# Patient Record
Sex: Female | Born: 1945
Health system: Southern US, Community
[De-identification: ages and names within clinical notes are randomized; demographics above are authoritative.]

## PROBLEM LIST (undated history)

## (undated) DIAGNOSIS — E079 Disorder of thyroid, unspecified: Secondary | ICD-10-CM

## (undated) DIAGNOSIS — E785 Hyperlipidemia, unspecified: Secondary | ICD-10-CM

## (undated) DIAGNOSIS — M549 Dorsalgia, unspecified: Secondary | ICD-10-CM

## (undated) HISTORY — PX: BACK SURGERY: SHX140

## (undated) HISTORY — DX: Hyperlipidemia, unspecified: E78.5

## (undated) HISTORY — DX: Disorder of thyroid, unspecified: E07.9

## (undated) HISTORY — PX: CATARACT EXTRACTION, BILATERAL: SHX1313

---

## 2000-03-13 ENCOUNTER — Encounter: Payer: Self-pay | Admitting: Family Medicine

## 2000-03-13 ENCOUNTER — Encounter: Admission: RE | Admit: 2000-03-13 | Discharge: 2000-03-13 | Payer: Self-pay | Admitting: Family Medicine

## 2001-09-03 ENCOUNTER — Encounter: Admission: RE | Admit: 2001-09-03 | Discharge: 2001-09-03 | Payer: Self-pay | Admitting: Family Medicine

## 2001-09-03 ENCOUNTER — Encounter: Payer: Self-pay | Admitting: Family Medicine

## 2003-01-01 ENCOUNTER — Encounter: Admission: RE | Admit: 2003-01-01 | Discharge: 2003-01-01 | Payer: Self-pay | Admitting: Family Medicine

## 2003-01-01 ENCOUNTER — Encounter: Payer: Self-pay | Admitting: Family Medicine

## 2004-05-11 ENCOUNTER — Other Ambulatory Visit: Admission: RE | Admit: 2004-05-11 | Discharge: 2004-05-11 | Payer: Self-pay | Admitting: Family Medicine

## 2005-05-24 ENCOUNTER — Other Ambulatory Visit: Admission: RE | Admit: 2005-05-24 | Discharge: 2005-05-24 | Payer: Self-pay | Admitting: Family Medicine

## 2006-02-11 ENCOUNTER — Emergency Department (HOSPITAL_COMMUNITY): Admission: EM | Admit: 2006-02-11 | Discharge: 2006-02-11 | Payer: Self-pay | Admitting: Emergency Medicine

## 2006-07-27 ENCOUNTER — Other Ambulatory Visit: Admission: RE | Admit: 2006-07-27 | Discharge: 2006-07-27 | Payer: Self-pay | Admitting: Family Medicine

## 2014-07-01 ENCOUNTER — Encounter: Payer: Self-pay | Admitting: Family Medicine

## 2014-07-01 ENCOUNTER — Ambulatory Visit (INDEPENDENT_AMBULATORY_CARE_PROVIDER_SITE_OTHER): Payer: Medicare Other | Admitting: Family Medicine

## 2014-07-01 VITALS — BP 114/64 | HR 62 | Temp 98.0°F | Ht 63.0 in | Wt 128.1 lb

## 2014-07-01 DIAGNOSIS — Z23 Encounter for immunization: Secondary | ICD-10-CM

## 2014-07-01 DIAGNOSIS — Z0000 Encounter for general adult medical examination without abnormal findings: Secondary | ICD-10-CM

## 2014-07-01 DIAGNOSIS — E785 Hyperlipidemia, unspecified: Secondary | ICD-10-CM

## 2014-07-01 NOTE — Progress Notes (Signed)
Subjective:    Kelly Wade is a 68 y.o. female who presents for a welcome to Medicare exam.   Cardiac risk factors: advanced age (older than 1 for men, 38 for women), dyslipidemia and sedentary lifestyle.  Activities of Daily Living  In your present state of health, do you have any difficulty performing the following activities?:  Preparing food and eating?: No Bathing yourself: No Getting dressed: No Using the toilet:No Moving around from place to place: No In the past year have you fallen or had a near fall?:No  Current exercise habits: The patient does not participate in regular exercise at present.   Dietary issues discussed: na   Depression Screen (Note: if answer to either of the following is "Yes", then a more complete depression screening is indicated)  Q1: Over the past two weeks, have you felt down, depressed or hopeless?no Q2: Over the past two weeks, have you felt little interest or pleasure in doing things? no   The following portions of the patient's history were reviewed and updated as appropriate: allergies, current medications, past family history, past medical history, past social history, past surgical history and problem list. Review of Systems Pertinent items are noted in HPI.    Objective:     Vision by Snellen chart: opth--in [place] ,Blood pressure 114/64, pulse 62, temperature 98 F (36.7 C), temperature source Oral, height 5\' 3"  (1.6 m), weight 128 lb 1.4 oz (58.1 kg), SpO2 99.00%. Body mass index is 22.7 kg/(m^2). BP 114/64  Pulse 62  Temp(Src) 98 F (36.7 C) (Oral)  Ht 5\' 3"  (1.6 m)  Wt 128 lb 1.4 oz (58.1 kg)  BMI 22.70 kg/m2  SpO2 99% General appearance: alert, cooperative, appears stated age and no distress Head: Normocephalic, without obvious abnormality, atraumatic Eyes: conjunctivae/corneas clear. PERRL, EOM's intact. Fundi benign. Ears: normal TM's and external ear canals both ears Nose: Nares normal. Septum midline. Mucosa normal.  No drainage or sinus tenderness. Throat: lips, mucosa, and tongue normal; teeth and gums normal Neck: no adenopathy, no carotid bruit, no JVD, supple, symmetrical, trachea midline and thyroid not enlarged, symmetric, no tenderness/mass/nodules Back: symmetric, no curvature. ROM normal. No CVA tenderness. Lungs: clear to auscultation bilaterally Breasts: normal appearance, no masses or tenderness Heart: regular rate and rhythm, S1, S2 normal, no murmur, click, rub or gallop Abdomen: soft, non-tender; bowel sounds normal; no masses,  no organomegaly Pelvic: not indicated; post-menopausal, no abnormal Pap smears in past Extremities: extremities normal, atraumatic, no cyanosis or edema Pulses: 2+ and symmetric Skin: Skin color, texture, turgor normal. No rashes or lesions Lymph nodes: Cervical, supraclavicular, and axillary nodes normal. Neurologic: Alert and oriented X 3, normal strength and tone. Normal symmetric reflexes. Normal coordination and gait Psych- no depression, no anxiety      Assessment:     cpe      Plan:     During the course of the visit the patient was educated and counseled about appropriate screening and preventive services including:   Pneumococcal vaccine   Screening electrocardiogram  Screening mammography  Bone densitometry screening  Glaucoma screening  Advanced directives: has NO advanced directive - not interested in additional information  Patient Instructions (the written plan) was given to the patient.   1. Welcome to Medicare preventive visit   - EKG [id number] - Basic metabolic panel - CBC with Differential - Hepatic function panel - Lipid panel - POCT urinalysis dipstick  2. Other and unspecified hyperlipidemia Check labs - Basic metabolic panel - CBC with  Differential - Hepatic function panel - Lipid panel - POCT urinalysis dipstick  3. Need for pneumococcal vaccination   - Pneumococcal polysaccharide vaccine 23-valent greater  than or equal to 2yo subcutaneous/IM

## 2014-07-01 NOTE — Patient Instructions (Signed)
Preventive Care for Adults A healthy lifestyle and preventive care can promote health and wellness. Preventive health guidelines for women include the following key practices.  A routine yearly physical is a good way to check with your health care provider about your health and preventive screening. It is a chance to share any concerns and updates on your health and to receive a thorough exam.  Visit your dentist for a routine exam and preventive care every 6 months. Brush your teeth twice a day and floss once a day. Good oral hygiene prevents tooth decay and gum disease.  The frequency of eye exams is based on your age, health, family medical history, use of contact lenses, and other factors. Follow your health care provider's recommendations for frequency of eye exams.  Eat a healthy diet. Foods like vegetables, fruits, whole grains, low-fat dairy products, and lean protein foods contain the nutrients you need without too many calories. Decrease your intake of foods high in solid fats, added sugars, and salt. Eat the right amount of calories for you.Get information about a proper diet from your health care provider, if necessary.  Regular physical exercise is one of the most important things you can do for your health. Most adults should get at least 150 minutes of moderate-intensity exercise (any activity that increases your heart rate and causes you to sweat) each week. In addition, most adults need muscle-strengthening exercises on 2 or more days a week.  Maintain a healthy weight. The body mass index (BMI) is a screening tool to identify possible weight problems. It provides an estimate of body fat based on height and weight. Your health care provider can find your BMI and can help you achieve or maintain a healthy weight.For adults 20 years and older:  A BMI below 18.5 is considered underweight.  A BMI of 18.5 to 24.9 is normal.  A BMI of 25 to 29.9 is considered overweight.  A BMI of  30 and above is considered obese.  Maintain normal blood lipids and cholesterol levels by exercising and minimizing your intake of saturated fat. Eat a balanced diet with plenty of fruit and vegetables. Blood tests for lipids and cholesterol should begin at age 65 and be repeated every 5 years. If your lipid or cholesterol levels are high, you are over 50, or you are at high risk for heart disease, you may need your cholesterol levels checked more frequently.Ongoing high lipid and cholesterol levels should be treated with medicines if diet and exercise are not working.  If you smoke, find out from your health care provider how to quit. If you do not use tobacco, do not start.  Lung cancer screening is recommended for adults aged 42-80 years who are at high risk for developing lung cancer because of a history of smoking. A yearly low-dose CT scan of the lungs is recommended for people who have at least a 30-pack-year history of smoking and are a current smoker or have quit within the past 15 years. A pack year of smoking is smoking an average of 1 pack of cigarettes a day for 1 year (for example: 1 pack a day for 30 years or 2 packs a day for 15 years). Yearly screening should continue until the smoker has stopped smoking for at least 15 years. Yearly screening should be stopped for people who develop a health problem that would prevent them from having lung cancer treatment.  If you are pregnant, do not drink alcohol. If you are breastfeeding,  be very cautious about drinking alcohol. If you are not pregnant and choose to drink alcohol, do not have more than 1 drink per day. One drink is considered to be 12 ounces (355 mL) of beer, 5 ounces (148 mL) of wine, or 1.5 ounces (44 mL) of liquor.  Avoid use of street drugs. Do not share needles with anyone. Ask for help if you need support or instructions about stopping the use of drugs.  High blood pressure causes heart disease and increases the risk of  stroke. Your blood pressure should be checked at least every 1 to 2 years. Ongoing high blood pressure should be treated with medicines if weight loss and exercise do not work.  If you are 45-49 years old, ask your health care provider if you should take aspirin to prevent strokes.  Diabetes screening involves taking a blood sample to check your fasting blood sugar level. This should be done once every 3 years, after age 104, if you are within normal weight and without risk factors for diabetes. Testing should be considered at a younger age or be carried out more frequently if you are overweight and have at least 1 risk factor for diabetes.  Breast cancer screening is essential preventive care for women. You should practice "breast self-awareness." This means understanding the normal appearance and feel of your breasts and may include breast self-examination. Any changes detected, no matter how small, should be reported to a health care provider. Women in their 29s and 30s should have a clinical breast exam (CBE) by a health care provider as part of a regular health exam every 1 to 3 years. After age 52, women should have a CBE every year. Starting at age 46, women should consider having a mammogram (breast X-ray test) every year. Women who have a family history of breast cancer should talk to their health care provider about genetic screening. Women at a high risk of breast cancer should talk to their health care providers about having an MRI and a mammogram every year.  Breast cancer gene (BRCA)-related cancer risk assessment is recommended for women who have family members with BRCA-related cancers. BRCA-related cancers include breast, ovarian, tubal, and peritoneal cancers. Having family members with these cancers may be associated with an increased risk for harmful changes (mutations) in the breast cancer genes BRCA1 and BRCA2. Results of the assessment will determine the need for genetic counseling and  BRCA1 and BRCA2 testing.  Routine pelvic exams to screen for cancer are no longer recommended for nonpregnant women who are considered low risk for cancer of the pelvic organs (ovaries, uterus, and vagina) and who do not have symptoms. Ask your health care provider if a screening pelvic exam is right for you.  If you have had past treatment for cervical cancer or a condition that could lead to cancer, you need Pap tests and screening for cancer for at least 20 years after your treatment. If Pap tests have been discontinued, your risk factors (such as having a new sexual partner) need to be reassessed to determine if screening should be resumed. Some women have medical problems that increase the chance of getting cervical cancer. In these cases, your health care provider may recommend more frequent screening and Pap tests.  The HPV test is an additional test that may be used for cervical cancer screening. The HPV test looks for the virus that can cause the cell changes on the cervix. The cells collected during the Pap test can be  tested for HPV. The HPV test could be used to screen women aged 63 years and older, and should be used in women of any age who have unclear Pap test results. After the age of 41, women should have HPV testing at the same frequency as a Pap test.  Colorectal cancer can be detected and often prevented. Most routine colorectal cancer screening begins at the age of 93 years and continues through age 63 years. However, your health care provider may recommend screening at an earlier age if you have risk factors for colon cancer. On a yearly basis, your health care provider may provide home test kits to check for hidden blood in the stool. Use of a small camera at the end of a tube, to directly examine the colon (sigmoidoscopy or colonoscopy), can detect the earliest forms of colorectal cancer. Talk to your health care provider about this at age 35, when routine screening begins. Direct  exam of the colon should be repeated every 5-10 years through age 24 years, unless early forms of pre-cancerous polyps or small growths are found.  People who are at an increased risk for hepatitis B should be screened for this virus. You are considered at high risk for hepatitis B if:  You were born in a country where hepatitis B occurs often. Talk with your health care provider about which countries are considered high risk.  Your parents were born in a high-risk country and you have not received a shot to protect against hepatitis B (hepatitis B vaccine).  You have HIV or AIDS.  You use needles to inject street drugs.  You live with, or have sex with, someone who has hepatitis B.  You get hemodialysis treatment.  You take certain medicines for conditions like cancer, organ transplantation, and autoimmune conditions.  Hepatitis C blood testing is recommended for all people born from [birth year] through 1965 and any individual with known risks for hepatitis C.  Practice safe sex. Use condoms and avoid high-risk sexual practices to reduce the spread of sexually transmitted infections (STIs). STIs include gonorrhea, chlamydia, syphilis, trichomonas, herpes, HPV, and human immunodeficiency virus (HIV). Herpes, HIV, and HPV are viral illnesses that have no cure. They can result in disability, cancer, and death.  You should be screened for sexually transmitted illnesses (STIs) including gonorrhea and chlamydia if:  You are sexually active and are younger than 24 years.  You are older than 24 years and your health care provider tells you that you are at risk for this type of infection.  Your sexual activity has changed since you were last screened and you are at an increased risk for chlamydia or gonorrhea. Ask your health care provider if you are at risk.  If you are at risk of being infected with HIV, it is recommended that you take a prescription medicine daily to prevent HIV infection. This is  called preexposure prophylaxis (PrEP). You are considered at risk if:  You are a heterosexual woman, are sexually active, and are at increased risk for HIV infection.  You take drugs by injection.  You are sexually active with a partner who has HIV.  Talk with your health care provider about whether you are at high risk of being infected with HIV. If you choose to begin PrEP, you should first be tested for HIV. You should then be tested every 3 months for as long as you are taking PrEP.  Osteoporosis is a disease in which the bones lose minerals and strength  with aging. This can result in serious bone fractures or breaks. The risk of osteoporosis can be identified using a bone density scan. Women ages 71 years and over and women at risk for fractures or osteoporosis should discuss screening with their health care providers. Ask your health care provider whether you should take a calcium supplement or vitamin D to reduce the rate of osteoporosis.  Menopause can be associated with physical symptoms and risks. Hormone replacement therapy is available to decrease symptoms and risks. You should talk to your health care provider about whether hormone replacement therapy is right for you.  Use sunscreen. Apply sunscreen liberally and repeatedly throughout the day. You should seek shade when your shadow is shorter than you. Protect yourself by wearing long sleeves, pants, a wide-brimmed hat, and sunglasses year round, whenever you are outdoors.  Once a month, do a whole body skin exam, using a mirror to look at the skin on your back. Tell your health care provider of new moles, moles that have irregular borders, moles that are larger than a pencil eraser, or moles that have changed in shape or color.  Stay current with required vaccines (immunizations).  Influenza vaccine. All adults should be immunized every year.  Tetanus, diphtheria, and acellular pertussis (Td, Tdap) vaccine. Pregnant women should  receive 1 dose of Tdap vaccine during each pregnancy. The dose should be obtained regardless of the length of time since the last dose. Immunization is preferred during the 27th-36th week of gestation. An adult who has not previously received Tdap or who does not know her vaccine status should receive 1 dose of Tdap. This initial dose should be followed by tetanus and diphtheria toxoids (Td) booster doses every 10 years. Adults with an unknown or incomplete history of completing a 3-dose immunization series with Td-containing vaccines should begin or complete a primary immunization series including a Tdap dose. Adults should receive a Td booster every 10 years.  Varicella vaccine. An adult without evidence of immunity to varicella should receive 2 doses or a second dose if she has previously received 1 dose. Pregnant females who do not have evidence of immunity should receive the first dose after pregnancy. This first dose should be obtained before leaving the health care facility. The second dose should be obtained 4-8 weeks after the first dose.  Human papillomavirus (HPV) vaccine. Females aged 13-26 years who have not received the vaccine previously should obtain the 3-dose series. The vaccine is not recommended for use in pregnant females. However, pregnancy testing is not needed before receiving a dose. If a female is found to be pregnant after receiving a dose, no treatment is needed. In that case, the remaining doses should be delayed until after the pregnancy. Immunization is recommended for any person with an immunocompromised condition through the age of 74 years if she did not get any or all doses earlier. During the 3-dose series, the second dose should be obtained 4-8 weeks after the first dose. The third dose should be obtained 24 weeks after the first dose and 16 weeks after the second dose.  Zoster vaccine. One dose is recommended for adults aged 23 years or older unless certain conditions are  present.  Measles, mumps, and rubella (MMR) vaccine. Adults born before [birth year] generally are considered immune to measles and mumps. Adults born in [birth year] or later should have 1 or more doses of MMR vaccine unless there is a contraindication to the vaccine or there is laboratory evidence of immunity to  each of the three diseases. A routine second dose of MMR vaccine should be obtained at least 28 days after the first dose for students attending postsecondary schools, health care workers, or international travelers. People who received inactivated measles vaccine or an unknown type of measles vaccine during 1963-1967 should receive 2 doses of MMR vaccine. People who received inactivated mumps vaccine or an unknown type of mumps vaccine before 1979 and are at high risk for mumps infection should consider immunization with 2 doses of MMR vaccine. For females of childbearing age, rubella immunity should be determined. If there is no evidence of immunity, females who are not pregnant should be vaccinated. If there is no evidence of immunity, females who are pregnant should delay immunization until after pregnancy. Unvaccinated health care workers born before 1957 who lack laboratory evidence of measles, mumps, or rubella immunity or laboratory confirmation of disease should consider measles and mumps immunization with 2 doses of MMR vaccine or rubella immunization with 1 dose of MMR vaccine.  Pneumococcal 13-valent conjugate (PCV13) vaccine. When indicated, a person who is uncertain of her immunization history and has no record of immunization should receive the PCV13 vaccine. An adult aged 19 years or older who has certain medical conditions and has not been previously immunized should receive 1 dose of PCV13 vaccine. This PCV13 should be followed with a dose of pneumococcal polysaccharide (PPSV23) vaccine. The PPSV23 vaccine dose should be obtained at least 8 weeks after the dose of PCV13 vaccine. An adult aged 19  years or older who has certain medical conditions and previously received 1 or more doses of PPSV23 vaccine should receive 1 dose of PCV13. The PCV13 vaccine dose should be obtained 1 or more years after the last PPSV23 vaccine dose.  Pneumococcal polysaccharide (PPSV23) vaccine. When PCV13 is also indicated, PCV13 should be obtained first. All adults aged 65 years and older should be immunized. An adult younger than age 65 years who has certain medical conditions should be immunized. Any person who resides in a nursing home or long-term care facility should be immunized. An adult smoker should be immunized. People with an immunocompromised condition and certain other conditions should receive both PCV13 and PPSV23 vaccines. People with human immunodeficiency virus (HIV) infection should be immunized as soon as possible after diagnosis. Immunization during chemotherapy or radiation therapy should be avoided. Routine use of PPSV23 vaccine is not recommended for American Indians, [place] Natives, or people younger than 65 years unless there are medical conditions that require PPSV23 vaccine. When indicated, people who have unknown immunization and have no record of immunization should receive PPSV23 vaccine. One-time revaccination 5 years after the first dose of PPSV23 is recommended for people aged 19-64 years who have chronic kidney failure, nephrotic syndrome, asplenia, or immunocompromised conditions. People who received 1-2 doses of PPSV23 before age 65 years should receive another dose of PPSV23 vaccine at age 65 years or later if at least 5 years have passed since the previous dose. Doses of PPSV23 are not needed for people immunized with PPSV23 at or after age 65 years.  Meningococcal vaccine. Adults with asplenia or persistent complement component deficiencies should receive 2 doses of quadrivalent meningococcal conjugate (MenACWY-D) vaccine. The doses should be obtained at least 2 months apart.  Microbiologists working with certain meningococcal bacteria, military recruits, people at risk during an outbreak, and people who travel to or live in countries with a high rate of meningitis should be immunized. A first-year college student up through age   21 years who is living in a residence hall should receive a dose if she did not receive a dose on or after her 16th birthday. Adults who have certain high-risk conditions should receive one or more doses of vaccine.  Hepatitis A vaccine. Adults who wish to be protected from this disease, have certain high-risk conditions, work with hepatitis A-infected animals, work in hepatitis A research labs, or travel to or work in countries with a high rate of hepatitis A should be immunized. Adults who were previously unvaccinated and who anticipate close contact with an international adoptee during the first 60 days after arrival in the [place] from a country with a high rate of hepatitis A should be immunized.  Hepatitis B vaccine. Adults who wish to be protected from this disease, have certain high-risk conditions, may be exposed to blood or other infectious body fluids, are household contacts or sex partners of hepatitis B positive people, are clients or workers in certain care facilities, or travel to or work in countries with a high rate of hepatitis B should be immunized.  Haemophilus influenzae type b (Hib) vaccine. A previously unvaccinated person with asplenia or sickle cell disease or having a scheduled splenectomy should receive 1 dose of Hib vaccine. Regardless of previous immunization, a recipient of a hematopoietic stem cell transplant should receive a 3-dose series 6-12 months after her successful transplant. Hib vaccine is not recommended for adults with HIV infection. Preventive Services / Frequency Ages 20 to 81 years  Blood pressure check.** / Every 1 to 2 years.  Lipid and cholesterol check.** / Every 5 years beginning at age  67.  Clinical breast exam.** / Every 3 years for women in their 51s and 47s.  BRCA-related cancer risk assessment.** / For women who have family members with a BRCA-related cancer (breast, ovarian, tubal, or peritoneal cancers).  Pap test.** / Every 2 years from ages 73 through 27. Every 3 years starting at age 6 through age 65 or 31 with a history of 3 consecutive normal Pap tests.  HPV screening.** / Every 3 years from ages 53 through ages 79 to 54 with a history of 3 consecutive normal Pap tests.  Hepatitis C blood test.** / For any individual with known risks for hepatitis C.  Skin self-exam. / Monthly.  Influenza vaccine. / Every year.  Tetanus, diphtheria, and acellular pertussis (Tdap, Td) vaccine.** / Consult your health care provider. Pregnant women should receive 1 dose of Tdap vaccine during each pregnancy. 1 dose of Td every 10 years.  Varicella vaccine.** / Consult your health care provider. Pregnant females who do not have evidence of immunity should receive the first dose after pregnancy.  HPV vaccine. / 3 doses over 6 months, if 7 and younger. The vaccine is not recommended for use in pregnant females. However, pregnancy testing is not needed before receiving a dose.  Measles, mumps, rubella (MMR) vaccine.** / You need at least 1 dose of MMR if you were born in 1957 or later. You may also need a 2nd dose. For females of childbearing age, rubella immunity should be determined. If there is no evidence of immunity, females who are not pregnant should be vaccinated. If there is no evidence of immunity, females who are pregnant should delay immunization until after pregnancy.  Pneumococcal 13-valent conjugate (PCV13) vaccine.** / Consult your health care provider.  Pneumococcal polysaccharide (PPSV23) vaccine.** / 1 to 2 doses if you smoke cigarettes or if you have certain conditions.  Meningococcal vaccine.** /  1 dose if you are age 77 to 69 years and a [place] living in a residence hall, or have one of several medical conditions, you need to get vaccinated against meningococcal disease. You may also need additional booster doses.  Hepatitis A vaccine.** / Consult your health care provider.  Hepatitis B vaccine.** / Consult your health care provider.  Haemophilus influenzae type b (Hib) vaccine.** / Consult your health care provider. Ages 62 to 63 years  Blood pressure check.** / Every 1 to 2 years.  Lipid and cholesterol check.** / Every 5 years beginning at age 70 years.  Lung cancer screening. / Every year if you are aged 74-80 years and have a 30-pack-year history of smoking and currently smoke or have quit within the past 15 years. Yearly screening is stopped once you have quit smoking for at least 15 years or develop a health problem that would prevent you from having lung cancer treatment.  Clinical breast exam.** / Every year after age 5 years.  BRCA-related cancer risk assessment.** / For women who have family members with a BRCA-related cancer (breast, ovarian, tubal, or peritoneal cancers).  Mammogram.** / Every year beginning at age 12 years and continuing for as long as you are in good health. Consult with your health care provider.  Pap test.** / Every 3 years starting at age 41 years through age 61 or 54 years with a history of 3 consecutive normal Pap tests.  HPV screening.** / Every 3 years from ages 43 years through ages 45 to 26 years with a history of 3 consecutive normal Pap tests.  Fecal occult blood test (FOBT) of stool. / Every year beginning at age 94 years and continuing until age 53 years. You may not need to do this test if you get a colonoscopy every 10 years.  Flexible sigmoidoscopy or colonoscopy.** / Every 5 years for a flexible sigmoidoscopy or every 10 years for a colonoscopy beginning at age 51 years and continuing until age 38 years.  Hepatitis C blood test.** / For all people born from [birth year] through  1965 and any individual with known risks for hepatitis C.  Skin self-exam. / Monthly.  Influenza vaccine. / Every year.  Tetanus, diphtheria, and acellular pertussis (Tdap/Td) vaccine.** / Consult your health care provider. Pregnant women should receive 1 dose of Tdap vaccine during each pregnancy. 1 dose of Td every 10 years.  Varicella vaccine.** / Consult your health care provider. Pregnant females who do not have evidence of immunity should receive the first dose after pregnancy.  Zoster vaccine.** / 1 dose for adults aged 41 years or older.  Measles, mumps, rubella (MMR) vaccine.** / You need at least 1 dose of MMR if you were born in 1957 or later. You may also need a 2nd dose. For females of childbearing age, rubella immunity should be determined. If there is no evidence of immunity, females who are not pregnant should be vaccinated. If there is no evidence of immunity, females who are pregnant should delay immunization until after pregnancy.  Pneumococcal 13-valent conjugate (PCV13) vaccine.** / Consult your health care provider.  Pneumococcal polysaccharide (PPSV23) vaccine.** / 1 to 2 doses if you smoke cigarettes or if you have certain conditions.  Meningococcal vaccine.** / Consult your health care provider.  Hepatitis A vaccine.** / Consult your health care provider.  Hepatitis B vaccine.** / Consult your health care provider.  Haemophilus influenzae type b (Hib) vaccine.** / Consult your health care provider. Ages 54  years and over  Blood pressure check.** / Every 1 to 2 years.  Lipid and cholesterol check.** / Every 5 years beginning at age 67 years.  Lung cancer screening. / Every year if you are aged 58-80 years and have a 30-pack-year history of smoking and currently smoke or have quit within the past 15 years. Yearly screening is stopped once you have quit smoking for at least 15 years or develop a health problem that would prevent you from having lung cancer  treatment.  Clinical breast exam.** / Every year after age 19 years.  BRCA-related cancer risk assessment.** / For women who have family members with a BRCA-related cancer (breast, ovarian, tubal, or peritoneal cancers).  Mammogram.** / Every year beginning at age 55 years and continuing for as long as you are in good health. Consult with your health care provider.  Pap test.** / Every 3 years starting at age 62 years through age 70 or 19 years with 3 consecutive normal Pap tests. Testing can be stopped between 65 and 70 years with 3 consecutive normal Pap tests and no abnormal Pap or HPV tests in the past 10 years.  HPV screening.** / Every 3 years from ages 33 years through ages 95 or 4 years with a history of 3 consecutive normal Pap tests. Testing can be stopped between 65 and 70 years with 3 consecutive normal Pap tests and no abnormal Pap or HPV tests in the past 10 years.  Fecal occult blood test (FOBT) of stool. / Every year beginning at age 40 years and continuing until age 93 years. You may not need to do this test if you get a colonoscopy every 10 years.  Flexible sigmoidoscopy or colonoscopy.** / Every 5 years for a flexible sigmoidoscopy or every 10 years for a colonoscopy beginning at age 29 years and continuing until age 44 years.  Hepatitis C blood test.** / For all people born from [birth year] through 1963 and any individual with known risks for hepatitis C.  Osteoporosis screening.** / A one-time screening for women ages 65 years and over and women at risk for fractures or osteoporosis.  Skin self-exam. / Monthly.  Influenza vaccine. / Every year.  Tetanus, diphtheria, and acellular pertussis (Tdap/Td) vaccine.** / 1 dose of Td every 10 years.  Varicella vaccine.** / Consult your health care provider.  Zoster vaccine.** / 1 dose for adults aged 72 years or older.  Pneumococcal 13-valent conjugate (PCV13) vaccine.** / Consult your health care provider.  Pneumococcal  polysaccharide (PPSV23) vaccine.** / 1 dose for all adults aged 93 years and older.  Meningococcal vaccine.** / Consult your health care provider.  Hepatitis A vaccine.** / Consult your health care provider.  Hepatitis B vaccine.** / Consult your health care provider.  Haemophilus influenzae type b (Hib) vaccine.** / Consult your health care provider. ** Family history and personal history of risk and conditions may change your health care provider's recommendations. Document Released: 12/13/2001 Document Revised: 03/03/2014 Document Reviewed: 03/14/2011 [facility] Patient Information 2015 [place], [place]. This information is not intended to replace advice given to you by your health care provider. Make sure you discuss any questions you have with your health care provider.

## 2014-07-01 NOTE — Progress Notes (Signed)
Pre visit review using our clinic review tool, if applicable. No additional management support is needed unless otherwise documented below in the visit note. 

## 2014-07-02 LAB — CBC WITH DIFFERENTIAL/PLATELET
Basophils Absolute: 0 10*3/uL (ref 0.0–0.1)
Basophils Relative: 0.5 % (ref 0.0–3.0)
Eosinophils Absolute: 0.1 10*3/uL (ref 0.0–0.7)
Eosinophils Relative: 0.9 % (ref 0.0–5.0)
HCT: 37.6 % (ref 36.0–46.0)
Hemoglobin: 12.2 g/dL (ref 12.0–15.0)
Lymphocytes Relative: 38.1 % (ref 12.0–46.0)
Lymphs Abs: 2.7 10*3/uL (ref 0.7–4.0)
MCHC: 32.6 g/dL (ref 30.0–36.0)
MCV: 82.6 fl (ref 78.0–100.0)
Monocytes Absolute: 0.2 10*3/uL (ref 0.1–1.0)
Monocytes Relative: 2.5 % — ABNORMAL LOW (ref 3.0–12.0)
Neutro Abs: 4.1 10*3/uL (ref 1.4–7.7)
Neutrophils Relative %: 58 % (ref 43.0–77.0)
Platelets: 261 10*3/uL (ref 150.0–400.0)
RBC: 4.55 Mil/uL (ref 3.87–5.11)
RDW: 15.6 % — ABNORMAL HIGH (ref 11.5–15.5)
WBC: 7.1 10*3/uL (ref 4.0–10.5)

## 2014-07-02 LAB — BASIC METABOLIC PANEL
BUN: 10 mg/dL (ref 6–23)
CO2: 26 mEq/L (ref 19–32)
Calcium: 9.5 mg/dL (ref 8.4–10.5)
Chloride: 106 mEq/L (ref 96–112)
Creatinine, Ser: 0.7 mg/dL (ref 0.4–1.2)
GFR: 83.48 mL/min (ref >60.00)
Glucose, Bld: 79 mg/dL (ref 70–99)
Potassium: 4.7 mEq/L (ref 3.5–5.1)
Sodium: 139 mEq/L (ref 135–145)

## 2014-07-02 LAB — LIPID PANEL
Cholesterol: 147 mg/dL (ref 0–200)
HDL: 59.2 mg/dL (ref >39.00)
LDL Cholesterol: 71 mg/dL (ref 0–99)
NonHDL: 87.8
Total CHOL/HDL Ratio: 2
Triglycerides: 85 mg/dL (ref 0.0–149.0)
VLDL: 17 mg/dL (ref 0.0–40.0)

## 2014-07-02 LAB — HEPATIC FUNCTION PANEL
ALT: 19 U/L (ref 0–35)
AST: 23 U/L (ref 0–37)
Albumin: 3.7 g/dL (ref 3.5–5.2)
Alkaline Phosphatase: 72 U/L (ref 39–117)
Bilirubin, Direct: 0.1 mg/dL (ref 0.0–0.3)
Total Bilirubin: 0.6 mg/dL (ref 0.2–1.2)
Total Protein: 7.6 g/dL (ref 6.0–8.3)

## 2014-07-09 ENCOUNTER — Telehealth: Payer: Self-pay | Admitting: Family Medicine

## 2014-07-09 NOTE — Telephone Encounter (Signed)
Copy mailed.     KP 

## 2014-07-09 NOTE — Telephone Encounter (Signed)
Pt is requesting lab results from 07/01/14, pt would also like a copy sent by mail.

## 2014-07-14 ENCOUNTER — Telehealth: Payer: Self-pay | Admitting: Family Medicine

## 2014-07-14 NOTE — Telephone Encounter (Signed)
Caller name: Nicholl  Relation to pt: self  Call back number:  [phone]   Reason for call: husband would like to discuss wife back pain.

## 2014-07-14 NOTE — Telephone Encounter (Signed)
Pt called.  Call back.  Please call between 8 and 8:30.  [phone]

## 2014-07-14 NOTE — Telephone Encounter (Signed)
MSG left to call the office      KP 

## 2014-07-15 ENCOUNTER — Other Ambulatory Visit: Payer: Self-pay | Admitting: Family Medicine

## 2014-07-15 DIAGNOSIS — M545 Low back pain, unspecified: Secondary | ICD-10-CM

## 2014-07-15 NOTE — Telephone Encounter (Signed)
Yes she will need a referral.     KP

## 2014-07-15 NOTE — Telephone Encounter (Signed)
She can see either one--- does she need referral?

## 2014-07-15 NOTE — Telephone Encounter (Signed)
Spoke with patient and he stated his wife is having LBP, she had surgery in [place] in 2011. He wanted to know what type of doctor she would need to see if it is the Ortho or spine doctor? She has rods, ad screws in her back. He has all of the paperwork from [place] as well as X-rays, Max Cohen [phone] is in the network as well as Richardo Priest [address] [phone], both of these providers are Orthopedics.  Please advise     KP

## 2014-07-18 ENCOUNTER — Emergency Department (HOSPITAL_COMMUNITY)
Admission: EM | Admit: 2014-07-18 | Discharge: 2014-07-18 | Disposition: A | Discharge status: Home / Self Care | Payer: Medicare Other | Attending: Emergency Medicine | Admitting: Emergency Medicine

## 2014-07-18 ENCOUNTER — Emergency Department (HOSPITAL_COMMUNITY): Payer: Medicare Other

## 2014-07-18 ENCOUNTER — Encounter (HOSPITAL_COMMUNITY): Payer: Self-pay | Admitting: Emergency Medicine

## 2014-07-18 DIAGNOSIS — Z79899 Other long term (current) drug therapy: Secondary | ICD-10-CM | POA: Insufficient documentation

## 2014-07-18 DIAGNOSIS — G529 Cranial nerve disorder, unspecified: Secondary | ICD-10-CM | POA: Diagnosis not present

## 2014-07-18 DIAGNOSIS — Z9889 Other specified postprocedural states: Secondary | ICD-10-CM | POA: Diagnosis not present

## 2014-07-18 DIAGNOSIS — Z7982 Long term (current) use of aspirin: Secondary | ICD-10-CM | POA: Diagnosis not present

## 2014-07-18 DIAGNOSIS — M545 Low back pain, unspecified: Secondary | ICD-10-CM | POA: Insufficient documentation

## 2014-07-18 DIAGNOSIS — E785 Hyperlipidemia, unspecified: Secondary | ICD-10-CM | POA: Diagnosis not present

## 2014-07-18 HISTORY — DX: Dorsalgia, unspecified: M54.9

## 2014-07-18 LAB — URINALYSIS, ROUTINE W REFLEX MICROSCOPIC
Bilirubin Urine: NEGATIVE
Glucose, UA: NEGATIVE mg/dL
Hgb urine dipstick: NEGATIVE
Ketones, ur: NEGATIVE mg/dL
Nitrite: NEGATIVE
Protein, ur: NEGATIVE mg/dL
Specific Gravity, Urine: 1.009 (ref 1.005–1.030)
Urobilinogen, UA: 0.2 mg/dL (ref 0.0–1.0)
pH: 5.5 (ref 5.0–8.0)

## 2014-07-18 LAB — URINE MICROSCOPIC-ADD ON

## 2014-07-18 MED ORDER — PREDNISONE 20 MG PO TABS: 20.0000 mg | ORAL_TABLET | Freq: Two times a day (BID) | ORAL | Status: DC

## 2014-07-18 MED ORDER — HYDROCODONE-ACETAMINOPHEN 5-325 MG PO TABS: 1.0000 | ORAL_TABLET | ORAL | Status: DC | PRN

## 2014-07-18 NOTE — ED Notes (Addendum)
Pt presents with NAD- lumbar surgery in [place] 2011. Pt denies injury. Pt c/o of lumbar pain for several days. Pt took advil  400mg  before arrival with several hours relief then pain will start again. Pt denies numbness and tingling. Pt ambulates without problems. Denies N/V/D and fever and urinary problems

## 2014-07-18 NOTE — ED Notes (Signed)
Patient was educated not to drive, operate heavy machinery, or drink alcohol while taking narcotic medication.  

## 2014-07-18 NOTE — ED Notes (Signed)
MD at bedside. 

## 2014-07-18 NOTE — Discharge Instructions (Signed)
Back Pain, Adult °Low back pain is very common. About 1 in 5 people have back pain. The cause of low back pain is rarely dangerous. The pain often gets better over time. About half of people with a sudden onset of back pain feel better in just 2 weeks. About 8 in 10 people feel better by 6 weeks.  °CAUSES °Some common causes of back pain include: °· Strain of the muscles or ligaments supporting the spine. °· Wear and tear (degeneration) of the spinal discs. °· Arthritis. °· Direct injury to the back. °DIAGNOSIS °Most of the time, the direct cause of low back pain is not known. However, back pain can be treated effectively even when the exact cause of the pain is unknown. Answering your caregiver's questions about your overall health and symptoms is one of the most accurate ways to make sure the cause of your pain is not dangerous. If your caregiver needs more information, he or she may order lab work or imaging tests (X-rays or MRIs). However, even if imaging tests show changes in your back, this usually does not require surgery. °HOME CARE INSTRUCTIONS °For many people, back pain returns. Since low back pain is rarely dangerous, it is often a condition that people can learn to manage on their own.  °· Remain active. It is stressful on the back to sit or stand in one place. Do not sit, drive, or stand in one place for more than 30 minutes at a time. Take short walks on level surfaces as soon as pain allows. Try to increase the length of time you walk each day. °· Do not stay in bed. Resting more than 1 or 2 days can delay your recovery. °· Do not avoid exercise or work. Your body is made to move. It is not dangerous to be active, even though your back may hurt. Your back will likely heal faster if you return to being active before your pain is gone. °· Pay attention to your body when you  bend and lift. Many people have less discomfort when lifting if they bend their knees, keep the load close to their bodies, and  avoid twisting. Often, the most comfortable positions are those that put less stress on your recovering back. °· Find a comfortable position to sleep. Use a firm mattress and lie on your side with your knees slightly bent. If you lie on your back, put a pillow under your knees. °· Only take over-the-counter or prescription medicines as directed by your caregiver. Over-the-counter medicines to reduce pain and inflammation are often the most helpful. Your caregiver may prescribe muscle relaxant drugs. These medicines help dull your pain so you can more quickly return to your normal activities and healthy exercise. °· Put ice on the injured area. °¨ Put ice in a plastic bag. °¨ Place a towel between your skin and the bag. °¨ Leave the ice on for 15-20 minutes, 03-04 times a day for the first 2 to 3 days. After that, ice and heat may be alternated to reduce pain and spasms. °· Ask your caregiver about trying back exercises and gentle massage. This may be of some benefit. °· Avoid feeling anxious or stressed. Stress increases muscle tension and can worsen back pain. It is important to recognize when you are anxious or stressed and learn ways to manage it. Exercise is a great option. °SEEK MEDICAL CARE IF: °· You have pain that is not relieved with rest or medicine. °· You have pain that does not improve in 1 week. °· You have new symptoms. °· You are generally not feeling well. °SEEK   IMMEDIATE MEDICAL CARE IF:  °· You have pain that radiates from your back into your legs. °· You develop new bowel or bladder control problems. °· You have unusual weakness or numbness in your arms or legs. °· You develop nausea or vomiting. °· You develop abdominal pain. °· You feel faint. °Document Released: 10/17/2005 Document Revised: 04/17/2012 Document Reviewed: 02/18/2014 °[employer]® Patient Information ©2015 [employer]. This information is not intended to replace advice given to you by your health care provider. Make sure you  discuss any questions you have with your health care provider. ° °Back Exercises °Back exercises help treat and prevent back injuries. The goal of back exercises is to increase the strength of your abdominal and back muscles and the flexibility of your back. These exercises should be started when you no longer have back pain. Back exercises include: °· Pelvic Tilt. Lie on your back with your knees bent. Tilt your pelvis until the lower part of your back is against the floor. Hold this position 5 to 10 sec and repeat 5 to 10 times. °· Knee to Chest. Pull first 1 knee up against your chest and hold for 20 to 30 seconds, repeat this with the other knee, and then both knees. This may be done with the other leg straight or bent, whichever feels better. °· Sit-Ups or Curl-Ups. Bend your knees 90 degrees. Start with tilting your pelvis, and do a partial, slow sit-up, lifting your trunk only 30 to 45 degrees off the floor. Take at least 2 to 3 seconds for each sit-up. Do not do sit-ups with your knees out straight. If partial sit-ups are difficult, simply do the above but with only tightening your abdominal muscles and holding it as directed. °· Hip-Lift. Lie on your back with your knees flexed 90 degrees. Push down with your feet and shoulders as you raise your hips a couple inches off the floor; hold for 10 seconds, repeat 5 to 10 times. °· Back arches. Lie on your stomach, propping yourself up on bent elbows. Slowly press on your hands, causing an arch in your low back. Repeat 3 to 5 times. Any initial stiffness and discomfort should lessen with repetition over time. °· Shoulder-Lifts. Lie face down with arms beside your body. Keep hips and torso pressed to floor as you slowly lift your head and shoulders off the floor. °Do not overdo your exercises, especially in the beginning. Exercises may cause you some mild back discomfort which lasts for a few minutes; however, if the pain is more severe, or lasts for more than 15  minutes, do not continue exercises until you see your caregiver. Improvement with exercise therapy for back problems is slow.  °See your caregivers for assistance with developing a proper back exercise program. °Document Released: 11/24/2004 Document Revised: 01/09/2012 Document Reviewed: 08/18/2011 °ExitCare® Patient Information ©2015 [employer]. This information is not intended to replace advice given to you by your health care provider. Make sure you discuss any questions you have with your health care provider. ° °

## 2014-07-18 NOTE — ED Provider Notes (Signed)
CSN: [id number]     Arrival date & time 07/18/14  1317 History   First MD Initiated Contact with Patient 07/18/14 1456     Chief Complaint  Patient presents with  . Back Pain     (Consider location/radiation/quality/duration/timing/severity/associated sxs/prior Treatment) HPI  Kelly Wade is a 68 y.o. female since her onset of spontaneous back pain. Pain is left lower. There is no radicular component. There are no altered stooling or urine habits. She denies fever, nausea, vomiting, weakness, or dizziness. No change in ability to walk. She had surgery, lumbar fusion, 4 years ago. No real problems, since then. She is only using Advil, with partial relief of the discomfort. There are no known modifying factors.   Past Medical History  Diagnosis Date  . Hyperlipidemia   . Back pain    Past Surgical History  Procedure Laterality Date  . Back surgery      In [place]  . Cataract extraction, bilateral     Family History  Problem Relation Age of Onset  . Heart disease Father   . Heart disease Brother    History  Substance Use Topics  . Smoking status: Never Smoker   . Smokeless tobacco: Never Used  . Alcohol Use: No   OB History   Grav Para Term Preterm Abortions TAB SAB Ect Mult Living                 Review of Systems  All other systems reviewed and are negative.     Allergies  Review of patient's allergies indicates no known allergies.  Home Medications   Prior to Admission medications   Medication Sig Start Date End Date Taking? Authorizing Provider  aspirin 81 MG tablet Take 81 mg by mouth daily.   Yes Historical Provider, MD  atorvastatin (LIPITOR) 10 MG tablet Take 10 mg by mouth daily.   Yes Historical Provider, MD  Calcium Carbonate-Vit D-Min (CALCIUM 1200 PO) Take 2 tablets by mouth daily.   Yes Historical Provider, MD  ibuprofen (ADVIL,MOTRIN) 200 MG tablet Take 400 mg by mouth every 6 (six) hours as needed for mild pain.   Yes Historical Provider, MD   Multiple Vitamins-Minerals (MULTIVITAMIN WITH MINERALS) tablet Take 1 tablet by mouth daily.   Yes Historical Provider, MD  OVER THE COUNTER MEDICATION Take 1 tablet by mouth daily. FLex Seed Oil   Yes Historical Provider, MD   BP 162/79  Pulse 74  Temp(Src) 98.1 F (36.7 C) (Oral)  Resp 16  Ht 5\' 6"  (1.676 m)  Wt 128 lb (58.06 kg)  BMI 20.67 kg/m2  SpO2 100% Physical Exam  Nursing note and vitals reviewed. Constitutional: She is oriented to person, place, and time. She appears well-developed and well-nourished.  HENT:  Head: Normocephalic and atraumatic.  Eyes: Conjunctivae and EOM are normal. Pupils are equal, round, and reactive to light.  Neck: Normal range of motion and phonation normal. Neck supple.  Cardiovascular: Normal rate.   Pulmonary/Chest: Effort normal. She exhibits no tenderness.  Musculoskeletal: Normal range of motion.  Tender left lumbar without deformity.  Neurological: She is alert and oriented to person, place, and time. A cranial nerve deficit is present. She exhibits normal muscle tone. Coordination normal.  Normal strength and sensation, legs, bilaterally.  Skin: Skin is warm and dry.  Psychiatric: She has a normal mood and affect. Her behavior is normal. Judgment and thought content normal.    ED Course  Procedures (including critical care time)  16:20- no further complaints,  findings discussed with patient and husband   Labs Review Labs Reviewed  URINALYSIS, ROUTINE W REFLEX MICROSCOPIC - Abnormal; Notable for the following:    Leukocytes, UA SMALL (*)    All other components within normal limits  URINE MICROSCOPIC-ADD ON    Imaging Review No results found.   EKG Interpretation None      MDM   Final diagnoses:  Bilateral low back pain without sciatica    Nonspecific back pain, prior surgical hardware is intact and there are no evident acute spinal fractures. No clinical evidence for lumbar myelopathy  Nursing Notes Reviewed/ Care  Coordinated Applicable Imaging Reviewed Interpretation of Laboratory Data incorporated into ED treatment  The patient appears reasonably screened and/or stabilized for discharge and I doubt any other medical condition or other [facility] requiring further screening, evaluation, or treatment in the ED at this time prior to discharge.  Plan: Home Medications- norco, prednisone; Home Treatments- rest; return here if the recommended treatment, does not improve the symptoms; Recommended follow up- PCP prn  Richarda Blade, MD 07/18/14 [phone]

## 2014-09-18 ENCOUNTER — Ambulatory Visit (INDEPENDENT_AMBULATORY_CARE_PROVIDER_SITE_OTHER): Payer: Medicare Other

## 2014-09-18 ENCOUNTER — Telehealth: Payer: Self-pay | Admitting: Family Medicine

## 2014-09-18 DIAGNOSIS — Z23 Encounter for immunization: Secondary | ICD-10-CM

## 2014-09-18 MED ORDER — ATORVASTATIN CALCIUM 10 MG PO TABS: 10.0000 mg | ORAL_TABLET | Freq: Every day | ORAL | Status: DC

## 2014-09-18 NOTE — Telephone Encounter (Signed)
Caller name: Maryann Conners Relation to pt: Call back number: [phone] Pharmacy:   Reason for call: Pt requesting for rx atorvastatin (LIPITOR) 10 MG tablet since pt states only has 20 days of med left. Pt will pick up rx. Please advise.

## 2014-09-18 NOTE — Progress Notes (Signed)
Pre visit review using our clinic review tool, if applicable. No additional management support is needed unless otherwise documented below in the visit note. 

## 2014-09-18 NOTE — Telephone Encounter (Signed)
Patient aware Rx ready for pick up.      KP 

## 2014-09-18 NOTE — Progress Notes (Signed)
Pt tolerated injection well.  No signs of reaction upon leaving the clinic.

## 2015-03-17 ENCOUNTER — Telehealth: Payer: Self-pay | Admitting: Family Medicine

## 2015-03-17 DIAGNOSIS — Z1231 Encounter for screening mammogram for malignant neoplasm of breast: Secondary | ICD-10-CM

## 2015-03-17 DIAGNOSIS — E2839 Other primary ovarian failure: Secondary | ICD-10-CM

## 2015-03-17 NOTE — Telephone Encounter (Signed)
Patient's husband requested CPE,BMD and Mammogram for the patient. Gave him number to [facility] and ordered the Imaging. Apt scheduled, he has switched insurance companies from [employer] to [employer] for her as well.     KP

## 2015-03-17 NOTE — Telephone Encounter (Signed)
Caller: Lacye Mccarn, husband Ph#: [phone]  Pt husband requesting phone call about follow up appts needed. He was reading information from her last AVS and had a question.

## 2015-03-19 ENCOUNTER — Ambulatory Visit
Admission: RE | Admit: 2015-03-19 | Discharge: 2015-03-19 | Disposition: A | Discharge status: Home / Self Care | Payer: Commercial Managed Care - HMO | Source: Ambulatory Visit | Attending: Family Medicine | Admitting: Family Medicine

## 2015-03-19 DIAGNOSIS — E2839 Other primary ovarian failure: Secondary | ICD-10-CM

## 2015-03-19 DIAGNOSIS — Z1231 Encounter for screening mammogram for malignant neoplasm of breast: Secondary | ICD-10-CM

## 2015-03-24 ENCOUNTER — Other Ambulatory Visit: Payer: Self-pay | Admitting: Family Medicine

## 2015-03-24 DIAGNOSIS — R928 Other abnormal and inconclusive findings on diagnostic imaging of breast: Secondary | ICD-10-CM

## 2015-03-27 ENCOUNTER — Ambulatory Visit
Admission: RE | Admit: 2015-03-27 | Discharge: 2015-03-27 | Disposition: A | Discharge status: Home / Self Care | Payer: Commercial Managed Care - HMO | Source: Ambulatory Visit | Attending: Family Medicine | Admitting: Family Medicine

## 2015-03-27 DIAGNOSIS — R928 Other abnormal and inconclusive findings on diagnostic imaging of breast: Secondary | ICD-10-CM

## 2015-04-22 ENCOUNTER — Encounter: Payer: Self-pay | Admitting: Family Medicine

## 2015-05-05 ENCOUNTER — Telehealth: Payer: Self-pay | Admitting: Family Medicine

## 2015-05-05 NOTE — Telephone Encounter (Signed)
Pre visit letter mailed 04/30/15

## 2015-05-20 ENCOUNTER — Telehealth: Payer: Self-pay | Admitting: Behavioral Health

## 2015-05-20 NOTE — Telephone Encounter (Signed)
Unable to reach patient at time of Pre-Visit Call.  Left message for patient to return call when available.    

## 2015-05-21 ENCOUNTER — Ambulatory Visit (INDEPENDENT_AMBULATORY_CARE_PROVIDER_SITE_OTHER): Payer: Commercial Managed Care - HMO | Admitting: Family Medicine

## 2015-05-21 ENCOUNTER — Other Ambulatory Visit (HOSPITAL_COMMUNITY)
Admission: RE | Admit: 2015-05-21 | Discharge: 2015-05-21 | Disposition: A | Discharge status: Home / Self Care | Payer: Commercial Managed Care - HMO | Source: Ambulatory Visit | Attending: Family Medicine | Admitting: Family Medicine

## 2015-05-21 ENCOUNTER — Encounter: Payer: Self-pay | Admitting: Family Medicine

## 2015-05-21 VITALS — BP 126/74 | HR 49 | Temp 97.8°F | Ht 63.75 in | Wt 129.2 lb

## 2015-05-21 DIAGNOSIS — Z0000 Encounter for general adult medical examination without abnormal findings: Secondary | ICD-10-CM | POA: Diagnosis not present

## 2015-05-21 DIAGNOSIS — Z124 Encounter for screening for malignant neoplasm of cervix: Secondary | ICD-10-CM | POA: Insufficient documentation

## 2015-05-21 DIAGNOSIS — E785 Hyperlipidemia, unspecified: Secondary | ICD-10-CM

## 2015-05-21 LAB — BASIC METABOLIC PANEL
BUN: 10 mg/dL (ref 6–23)
CO2: 28 mEq/L (ref 19–32)
Calcium: 9.7 mg/dL (ref 8.4–10.5)
Chloride: 105 mEq/L (ref 96–112)
Creatinine, Ser: 0.84 mg/dL (ref 0.40–1.20)
GFR: 71.93 mL/min (ref >60.00)
Glucose, Bld: 100 mg/dL — ABNORMAL HIGH (ref 70–99)
Potassium: 3.8 mEq/L (ref 3.5–5.1)
Sodium: 139 mEq/L (ref 135–145)

## 2015-05-21 LAB — CBC WITH DIFFERENTIAL/PLATELET
Basophils Absolute: 0 10*3/uL (ref 0.0–0.1)
Basophils Relative: 0.5 % (ref 0.0–3.0)
Eosinophils Absolute: 0.1 10*3/uL (ref 0.0–0.7)
Eosinophils Relative: 1.4 % (ref 0.0–5.0)
HCT: 36 % (ref 36.0–46.0)
Hemoglobin: 11.8 g/dL — ABNORMAL LOW (ref 12.0–15.0)
Lymphocytes Relative: 43.6 % (ref 12.0–46.0)
Lymphs Abs: 2.4 10*3/uL (ref 0.7–4.0)
MCHC: 32.8 g/dL (ref 30.0–36.0)
MCV: 81.5 fl (ref 78.0–100.0)
Monocytes Absolute: 0.3 10*3/uL (ref 0.1–1.0)
Monocytes Relative: 5.8 % (ref 3.0–12.0)
Neutro Abs: 2.7 10*3/uL (ref 1.4–7.7)
Neutrophils Relative %: 48.7 % (ref 43.0–77.0)
Platelets: 240 10*3/uL (ref 150.0–400.0)
RBC: 4.42 Mil/uL (ref 3.87–5.11)
RDW: 15.2 % (ref 11.5–15.5)
WBC: 5.6 10*3/uL (ref 4.0–10.5)

## 2015-05-21 LAB — POCT URINALYSIS DIPSTICK
Bilirubin, UA: NEGATIVE
Blood, UA: NEGATIVE
Glucose, UA: NEGATIVE
Ketones, UA: NEGATIVE
Leukocytes, UA: NEGATIVE
Nitrite, UA: NEGATIVE
Protein, UA: NEGATIVE
Spec Grav, UA: 1.02
Urobilinogen, UA: 0.2
pH, UA: 6

## 2015-05-21 LAB — HEPATIC FUNCTION PANEL
ALT: 17 U/L (ref 0–35)
AST: 19 U/L (ref 0–37)
Albumin: 3.8 g/dL (ref 3.5–5.2)
Alkaline Phosphatase: 74 U/L (ref 39–117)
Bilirubin, Direct: 0.1 mg/dL (ref 0.0–0.3)
Total Bilirubin: 0.4 mg/dL (ref 0.2–1.2)
Total Protein: 7 g/dL (ref 6.0–8.3)

## 2015-05-21 LAB — LIPID PANEL
Cholesterol: 144 mg/dL (ref 0–200)
HDL: 55.9 mg/dL (ref >39.00)
LDL Cholesterol: 70 mg/dL (ref 0–99)
NonHDL: 88.1
Total CHOL/HDL Ratio: 3
Triglycerides: 92 mg/dL (ref 0.0–149.0)
VLDL: 18.4 mg/dL (ref 0.0–40.0)

## 2015-05-21 NOTE — Progress Notes (Signed)
Pre visit review using our clinic review tool, if applicable. No additional management support is needed unless otherwise documented below in the visit note. 

## 2015-05-21 NOTE — Patient Instructions (Signed)
Calcium '1200mg'$  daily and vita D3 1000 u daily---these are over the counter      Preventive Care for Adults A healthy lifestyle and preventive care can promote health and wellness. Preventive health guidelines for women include the following key practices.  A routine yearly physical is a good way to check with your health care provider about your health and preventive screening. It is a chance to share any concerns and updates on your health and to receive a thorough exam.  Visit your dentist for a routine exam and preventive care every 6 months. Brush your teeth twice a day and floss once a day. Good oral hygiene prevents tooth decay and gum disease.  The frequency of eye exams is based on your age, health, family medical history, use of contact lenses, and other factors. Follow your health care provider's recommendations for frequency of eye exams.  Eat a healthy diet. Foods like vegetables, fruits, whole grains, low-fat dairy products, and lean protein foods contain the nutrients you need without too many calories. Decrease your intake of foods high in solid fats, added sugars, and salt. Eat the right amount of calories for you.Get information about a proper diet from your health care provider, if necessary.  Regular physical exercise is one of the most important things you can do for your health. Most adults should get at least 150 minutes of moderate-intensity exercise (any activity that increases your heart rate and causes you to sweat) each week. In addition, most adults need muscle-strengthening exercises on 2 or more days a week.  Maintain a healthy weight. The body mass index (BMI) is a screening tool to identify possible weight problems. It provides an estimate of body fat based on height and weight. Your health care provider can find your BMI and can help you achieve or maintain a healthy weight.For adults 20 years and older:  A BMI below 18.5 is considered underweight.  A BMI of  18.5 to 24.9 is normal.  A BMI of 25 to 29.9 is considered overweight.  A BMI of 30 and above is considered obese.  Maintain normal blood lipids and cholesterol levels by exercising and minimizing your intake of saturated fat. Eat a balanced diet with plenty of fruit and vegetables. Blood tests for lipids and cholesterol should begin at age 55 and be repeated every 5 years. If your lipid or cholesterol levels are high, you are over 50, or you are at high risk for heart disease, you may need your cholesterol levels checked more frequently.Ongoing high lipid and cholesterol levels should be treated with medicines if diet and exercise are not working.  If you smoke, find out from your health care provider how to quit. If you do not use tobacco, do not start.  Lung cancer screening is recommended for adults aged 71-80 years who are at high risk for developing lung cancer because of a history of smoking. A yearly low-dose CT scan of the lungs is recommended for people who have at least a 30-pack-year history of smoking and are a current smoker or have quit within the past 15 years. A pack year of smoking is smoking an average of 1 pack of cigarettes a day for 1 year (for example: 1 pack a day for 30 years or 2 packs a day for 15 years). Yearly screening should continue until the smoker has stopped smoking for at least 15 years. Yearly screening should be stopped for people who develop a health problem that would prevent them  from having lung cancer treatment.  If you are pregnant, do not drink alcohol. If you are breastfeeding, be very cautious about drinking alcohol. If you are not pregnant and choose to drink alcohol, do not have more than 1 drink per day. One drink is considered to be 12 ounces (355 mL) of beer, 5 ounces (148 mL) of wine, or 1.5 ounces (44 mL) of liquor.  Avoid use of street drugs. Do not share needles with anyone. Ask for help if you need support or instructions about stopping the use  of drugs.  High blood pressure causes heart disease and increases the risk of stroke. Your blood pressure should be checked at least every 1 to 2 years. Ongoing high blood pressure should be treated with medicines if weight loss and exercise do not work.  If you are 66-27 years old, ask your health care provider if you should take aspirin to prevent strokes.  Diabetes screening involves taking a blood sample to check your fasting blood sugar level. This should be done once every 3 years, after age 38, if you are within normal weight and without risk factors for diabetes. Testing should be considered at a younger age or be carried out more frequently if you are overweight and have at least 1 risk factor for diabetes.  Breast cancer screening is essential preventive care for women. You should practice "breast self-awareness." This means understanding the normal appearance and feel of your breasts and may include breast self-examination. Any changes detected, no matter how small, should be reported to a health care provider. Women in their 22s and 30s should have a clinical breast exam (CBE) by a health care provider as part of a regular health exam every 1 to 3 years. After age 69, women should have a CBE every year. Starting at age 29, women should consider having a mammogram (breast X-ray test) every year. Women who have a family history of breast cancer should talk to their health care provider about genetic screening. Women at a high risk of breast cancer should talk to their health care providers about having an MRI and a mammogram every year.  Breast cancer gene (BRCA)-related cancer risk assessment is recommended for women who have family members with BRCA-related cancers. BRCA-related cancers include breast, ovarian, tubal, and peritoneal cancers. Having family members with these cancers may be associated with an increased risk for harmful changes (mutations) in the breast cancer genes BRCA1 and  BRCA2. Results of the assessment will determine the need for genetic counseling and BRCA1 and BRCA2 testing.  Routine pelvic exams to screen for cancer are no longer recommended for nonpregnant women who are considered low risk for cancer of the pelvic organs (ovaries, uterus, and vagina) and who do not have symptoms. Ask your health care provider if a screening pelvic exam is right for you.  If you have had past treatment for cervical cancer or a condition that could lead to cancer, you need Pap tests and screening for cancer for at least 20 years after your treatment. If Pap tests have been discontinued, your risk factors (such as having a new sexual partner) need to be reassessed to determine if screening should be resumed. Some women have medical problems that increase the chance of getting cervical cancer. In these cases, your health care provider may recommend more frequent screening and Pap tests.  The HPV test is an additional test that may be used for cervical cancer screening. The HPV test looks for the virus  that can cause the cell changes on the cervix. The cells collected during the Pap test can be tested for HPV. The HPV test could be used to screen women aged 97 years and older, and should be used in women of any age who have unclear Pap test results. After the age of 6, women should have HPV testing at the same frequency as a Pap test.  Colorectal cancer can be detected and often prevented. Most routine colorectal cancer screening begins at the age of 84 years and continues through age 32 years. However, your health care provider may recommend screening at an earlier age if you have risk factors for colon cancer. On a yearly basis, your health care provider may provide home test kits to check for hidden blood in the stool. Use of a small camera at the end of a tube, to directly examine the colon (sigmoidoscopy or colonoscopy), can detect the earliest forms of colorectal cancer. Talk to your  health care provider about this at age 46, when routine screening begins. Direct exam of the colon should be repeated every 5-10 years through age 66 years, unless early forms of pre-cancerous polyps or small growths are found.  People who are at an increased risk for hepatitis B should be screened for this virus. You are considered at high risk for hepatitis B if:  You were born in a country where hepatitis B occurs often. Talk with your health care provider about which countries are considered high risk.  Your parents were born in a high-risk country and you have not received a shot to protect against hepatitis B (hepatitis B vaccine).  You have HIV or AIDS.  You use needles to inject street drugs.  You live with, or have sex with, someone who has hepatitis B.  You get hemodialysis treatment.  You take certain medicines for conditions like cancer, organ transplantation, and autoimmune conditions.  Hepatitis C blood testing is recommended for all people born from [birth year] through 1965 and any individual with known risks for hepatitis C.  Practice safe sex. Use condoms and avoid high-risk sexual practices to reduce the spread of sexually transmitted infections (STIs). STIs include gonorrhea, chlamydia, syphilis, trichomonas, herpes, HPV, and human immunodeficiency virus (HIV). Herpes, HIV, and HPV are viral illnesses that have no cure. They can result in disability, cancer, and death.  You should be screened for sexually transmitted illnesses (STIs) including gonorrhea and chlamydia if:  You are sexually active and are younger than 24 years.  You are older than 24 years and your health care provider tells you that you are at risk for this type of infection.  Your sexual activity has changed since you were last screened and you are at an increased risk for chlamydia or gonorrhea. Ask your health care provider if you are at risk.  If you are at risk of being infected with HIV, it is  recommended that you take a prescription medicine daily to prevent HIV infection. This is called preexposure prophylaxis (PrEP). You are considered at risk if:  You are a heterosexual woman, are sexually active, and are at increased risk for HIV infection.  You take drugs by injection.  You are sexually active with a partner who has HIV.  Talk with your health care provider about whether you are at high risk of being infected with HIV. If you choose to begin PrEP, you should first be tested for HIV. You should then be tested every 3 months for as long  as you are taking PrEP.  Osteoporosis is a disease in which the bones lose minerals and strength with aging. This can result in serious bone fractures or breaks. The risk of osteoporosis can be identified using a bone density scan. Women ages 34 years and over and women at risk for fractures or osteoporosis should discuss screening with their health care providers. Ask your health care provider whether you should take a calcium supplement or vitamin D to reduce the rate of osteoporosis.  Menopause can be associated with physical symptoms and risks. Hormone replacement therapy is available to decrease symptoms and risks. You should talk to your health care provider about whether hormone replacement therapy is right for you.  Use sunscreen. Apply sunscreen liberally and repeatedly throughout the day. You should seek shade when your shadow is shorter than you. Protect yourself by wearing long sleeves, pants, a wide-brimmed hat, and sunglasses year round, whenever you are outdoors.  Once a month, do a whole body skin exam, using a mirror to look at the skin on your back. Tell your health care provider of new moles, moles that have irregular borders, moles that are larger than a pencil eraser, or moles that have changed in shape or color.  Stay current with required vaccines (immunizations).  Influenza vaccine. All adults should be immunized every  year.  Tetanus, diphtheria, and acellular pertussis (Td, Tdap) vaccine. Pregnant women should receive 1 dose of Tdap vaccine during each pregnancy. The dose should be obtained regardless of the length of time since the last dose. Immunization is preferred during the 27th-36th week of gestation. An adult who has not previously received Tdap or who does not know her vaccine status should receive 1 dose of Tdap. This initial dose should be followed by tetanus and diphtheria toxoids (Td) booster doses every 10 years. Adults with an unknown or incomplete history of completing a 3-dose immunization series with Td-containing vaccines should begin or complete a primary immunization series including a Tdap dose. Adults should receive a Td booster every 10 years.  Varicella vaccine. An adult without evidence of immunity to varicella should receive 2 doses or a second dose if she has previously received 1 dose. Pregnant females who do not have evidence of immunity should receive the first dose after pregnancy. This first dose should be obtained before leaving the health care facility. The second dose should be obtained 4-8 weeks after the first dose.  Human papillomavirus (HPV) vaccine. Females aged 13-26 years who have not received the vaccine previously should obtain the 3-dose series. The vaccine is not recommended for use in pregnant females. However, pregnancy testing is not needed before receiving a dose. If a female is found to be pregnant after receiving a dose, no treatment is needed. In that case, the remaining doses should be delayed until after the pregnancy. Immunization is recommended for any person with an immunocompromised condition through the age of 68 years if she did not get any or all doses earlier. During the 3-dose series, the second dose should be obtained 4-8 weeks after the first dose. The third dose should be obtained 24 weeks after the first dose and 16 weeks after the second dose.  Zoster  vaccine. One dose is recommended for adults aged 66 years or older unless certain conditions are present.  Measles, mumps, and rubella (MMR) vaccine. Adults born before [birth year] generally are considered immune to measles and mumps. Adults born in [birth year] or later should have 1 or more doses of  MMR vaccine unless there is a contraindication to the vaccine or there is laboratory evidence of immunity to each of the three diseases. A routine second dose of MMR vaccine should be obtained at least 28 days after the first dose for students attending postsecondary schools, health care workers, or international travelers. People who received inactivated measles vaccine or an unknown type of measles vaccine during 1963-1967 should receive 2 doses of MMR vaccine. People who received inactivated mumps vaccine or an unknown type of mumps vaccine before 1979 and are at high risk for mumps infection should consider immunization with 2 doses of MMR vaccine. For females of childbearing age, rubella immunity should be determined. If there is no evidence of immunity, females who are not pregnant should be vaccinated. If there is no evidence of immunity, females who are pregnant should delay immunization until after pregnancy. Unvaccinated health care workers born before [birth year] who lack laboratory evidence of measles, mumps, or rubella immunity or laboratory confirmation of disease should consider measles and mumps immunization with 2 doses of MMR vaccine or rubella immunization with 1 dose of MMR vaccine.  Pneumococcal 13-valent conjugate (PCV13) vaccine. When indicated, a person who is uncertain of her immunization history and has no record of immunization should receive the PCV13 vaccine. An adult aged 27 years or older who has certain medical conditions and has not been previously immunized should receive 1 dose of PCV13 vaccine. This PCV13 should be followed with a dose of pneumococcal polysaccharide (PPSV23) vaccine. The PPSV23  vaccine dose should be obtained at least 8 weeks after the dose of PCV13 vaccine. An adult aged 74 years or older who has certain medical conditions and previously received 1 or more doses of PPSV23 vaccine should receive 1 dose of PCV13. The PCV13 vaccine dose should be obtained 1 or more years after the last PPSV23 vaccine dose.  Pneumococcal polysaccharide (PPSV23) vaccine. When PCV13 is also indicated, PCV13 should be obtained first. All adults aged 64 years and older should be immunized. An adult younger than age 59 years who has certain medical conditions should be immunized. Any person who resides in a nursing home or long-term care facility should be immunized. An adult smoker should be immunized. People with an immunocompromised condition and certain other conditions should receive both PCV13 and PPSV23 vaccines. People with human immunodeficiency virus (HIV) infection should be immunized as soon as possible after diagnosis. Immunization during chemotherapy or radiation therapy should be avoided. Routine use of PPSV23 vaccine is not recommended for American Indians, [place] Natives, or people younger than 65 years unless there are medical conditions that require PPSV23 vaccine. When indicated, people who have unknown immunization and have no record of immunization should receive PPSV23 vaccine. One-time revaccination 5 years after the first dose of PPSV23 is recommended for people aged 19-64 years who have chronic kidney failure, nephrotic syndrome, asplenia, or immunocompromised conditions. People who received 1-2 doses of PPSV23 before age 8 years should receive another dose of PPSV23 vaccine at age 42 years or later if at least 5 years have passed since the previous dose. Doses of PPSV23 are not needed for people immunized with PPSV23 at or after age 75 years.  Meningococcal vaccine. Adults with asplenia or persistent complement component deficiencies should receive 2 doses of quadrivalent  meningococcal conjugate (MenACWY-D) vaccine. The doses should be obtained at least 2 months apart. Microbiologists working with certain meningococcal bacteria, [place] recruits, people at risk during an outbreak, and people who travel to or live  in countries with a high rate of meningitis should be immunized. A first-year college student up through age 71 years who is living in a residence hall should receive a dose if she did not receive a dose on or after her 16th birthday. Adults who have certain high-risk conditions should receive one or more doses of vaccine.  Hepatitis A vaccine. Adults who wish to be protected from this disease, have certain high-risk conditions, work with hepatitis A-infected animals, work in hepatitis A research labs, or travel to or work in countries with a high rate of hepatitis A should be immunized. Adults who were previously unvaccinated and who anticipate close contact with an international adoptee during the first 60 days after arrival in the [place] from a country with a high rate of hepatitis A should be immunized.  Hepatitis B vaccine. Adults who wish to be protected from this disease, have certain high-risk conditions, may be exposed to blood or other infectious body fluids, are household contacts or sex partners of hepatitis B positive people, are clients or workers in certain care facilities, or travel to or work in countries with a high rate of hepatitis B should be immunized.  Haemophilus influenzae type b (Hib) vaccine. A previously unvaccinated person with asplenia or sickle cell disease or having a scheduled splenectomy should receive 1 dose of Hib vaccine. Regardless of previous immunization, a recipient of a hematopoietic stem cell transplant should receive a 3-dose series 6-12 months after her successful transplant. Hib vaccine is not recommended for adults with HIV infection. Preventive Services / Frequency Ages 33 to 49 years  Blood pressure check.**  / Every 1 to 2 years.  Lipid and cholesterol check.** / Every 5 years beginning at age 26.  Clinical breast exam.** / Every 3 years for women in their 82s and 20s.  BRCA-related cancer risk assessment.** / For women who have family members with a BRCA-related cancer (breast, ovarian, tubal, or peritoneal cancers).  Pap test.** / Every 2 years from ages 3 through 81. Every 3 years starting at age 19 through age 35 or 75 with a history of 3 consecutive normal Pap tests.  HPV screening.** / Every 3 years from ages 62 through ages 51 to 30 with a history of 3 consecutive normal Pap tests.  Hepatitis C blood test.** / For any individual with known risks for hepatitis C.  Skin self-exam. / Monthly.  Influenza vaccine. / Every year.  Tetanus, diphtheria, and acellular pertussis (Tdap, Td) vaccine.** / Consult your health care provider. Pregnant women should receive 1 dose of Tdap vaccine during each pregnancy. 1 dose of Td every 10 years.  Varicella vaccine.** / Consult your health care provider. Pregnant females who do not have evidence of immunity should receive the first dose after pregnancy.  HPV vaccine. / 3 doses over 6 months, if 56 and younger. The vaccine is not recommended for use in pregnant females. However, pregnancy testing is not needed before receiving a dose.  Measles, mumps, rubella (MMR) vaccine.** / You need at least 1 dose of MMR if you were born in 1957 or later. You may also need a 2nd dose. For females of childbearing age, rubella immunity should be determined. If there is no evidence of immunity, females who are not pregnant should be vaccinated. If there is no evidence of immunity, females who are pregnant should delay immunization until after pregnancy.  Pneumococcal 13-valent conjugate (PCV13) vaccine.** / Consult your health care provider.  Pneumococcal polysaccharide (PPSV23) vaccine.** /  1 to 2 doses if you smoke cigarettes or if you have certain  conditions.  Meningococcal vaccine.** / 1 dose if you are age 80 to 63 years and a Market researcher living in a residence hall, or have one of several medical conditions, you need to get vaccinated against meningococcal disease. You may also need additional booster doses.  Hepatitis A vaccine.** / Consult your health care provider.  Hepatitis B vaccine.** / Consult your health care provider.  Haemophilus influenzae type b (Hib) vaccine.** / Consult your health care provider. Ages 77 to 92 years  Blood pressure check.** / Every 1 to 2 years.  Lipid and cholesterol check.** / Every 5 years beginning at age 65 years.  Lung cancer screening. / Every year if you are aged 50-80 years and have a 30-pack-year history of smoking and currently smoke or have quit within the past 15 years. Yearly screening is stopped once you have quit smoking for at least 15 years or develop a health problem that would prevent you from having lung cancer treatment.  Clinical breast exam.** / Every year after age 2 years.  BRCA-related cancer risk assessment.** / For women who have family members with a BRCA-related cancer (breast, ovarian, tubal, or peritoneal cancers).  Mammogram.** / Every year beginning at age 85 years and continuing for as long as you are in good health. Consult with your health care provider.  Pap test.** / Every 3 years starting at age 55 years through age 41 or 79 years with a history of 3 consecutive normal Pap tests.  HPV screening.** / Every 3 years from ages 75 years through ages 53 to 77 years with a history of 3 consecutive normal Pap tests.  Fecal occult blood test (FOBT) of stool. / Every year beginning at age 59 years and continuing until age 70 years. You may not need to do this test if you get a colonoscopy every 10 years.  Flexible sigmoidoscopy or colonoscopy.** / Every 5 years for a flexible sigmoidoscopy or every 10 years for a colonoscopy beginning at age 97 years  and continuing until age 67 years.  Hepatitis C blood test.** / For all people born from [birth year] through 1965 and any individual with known risks for hepatitis C.  Skin self-exam. / Monthly.  Influenza vaccine. / Every year.  Tetanus, diphtheria, and acellular pertussis (Tdap/Td) vaccine.** / Consult your health care provider. Pregnant women should receive 1 dose of Tdap vaccine during each pregnancy. 1 dose of Td every 10 years.  Varicella vaccine.** / Consult your health care provider. Pregnant females who do not have evidence of immunity should receive the first dose after pregnancy.  Zoster vaccine.** / 1 dose for adults aged 72 years or older.  Measles, mumps, rubella (MMR) vaccine.** / You need at least 1 dose of MMR if you were born in 1957 or later. You may also need a 2nd dose. For females of childbearing age, rubella immunity should be determined. If there is no evidence of immunity, females who are not pregnant should be vaccinated. If there is no evidence of immunity, females who are pregnant should delay immunization until after pregnancy.  Pneumococcal 13-valent conjugate (PCV13) vaccine.** / Consult your health care provider.  Pneumococcal polysaccharide (PPSV23) vaccine.** / 1 to 2 doses if you smoke cigarettes or if you have certain conditions.  Meningococcal vaccine.** / Consult your health care provider.  Hepatitis A vaccine.** / Consult your health care provider.  Hepatitis B vaccine.** / Consult your  health care provider.  Haemophilus influenzae type b (Hib) vaccine.** / Consult your health care provider. Ages 75 years and over  Blood pressure check.** / Every 1 to 2 years.  Lipid and cholesterol check.** / Every 5 years beginning at age 45 years.  Lung cancer screening. / Every year if you are aged 66-80 years and have a 30-pack-year history of smoking and currently smoke or have quit within the past 15 years. Yearly screening is stopped once you have quit smoking  for at least 15 years or develop a health problem that would prevent you from having lung cancer treatment.  Clinical breast exam.** / Every year after age 42 years.  BRCA-related cancer risk assessment.** / For women who have family members with a BRCA-related cancer (breast, ovarian, tubal, or peritoneal cancers).  Mammogram.** / Every year beginning at age 68 years and continuing for as long as you are in good health. Consult with your health care provider.  Pap test.** / Every 3 years starting at age 4 years through age 69 or 75 years with 3 consecutive normal Pap tests. Testing can be stopped between 65 and 70 years with 3 consecutive normal Pap tests and no abnormal Pap or HPV tests in the past 10 years.  HPV screening.** / Every 3 years from ages 23 years through ages 67 or 65 years with a history of 3 consecutive normal Pap tests. Testing can be stopped between 65 and 70 years with 3 consecutive normal Pap tests and no abnormal Pap or HPV tests in the past 10 years.  Fecal occult blood test (FOBT) of stool. / Every year beginning at age 11 years and continuing until age 74 years. You may not need to do this test if you get a colonoscopy every 10 years.  Flexible sigmoidoscopy or colonoscopy.** / Every 5 years for a flexible sigmoidoscopy or every 10 years for a colonoscopy beginning at age 68 years and continuing until age 33 years.  Hepatitis C blood test.** / For all people born from [birth year] through 1963 and any individual with known risks for hepatitis C.  Osteoporosis screening.** / A one-time screening for women ages 67 years and over and women at risk for fractures or osteoporosis.  Skin self-exam. / Monthly.  Influenza vaccine. / Every year.  Tetanus, diphtheria, and acellular pertussis (Tdap/Td) vaccine.** / 1 dose of Td every 10 years.  Varicella vaccine.** / Consult your health care provider.  Zoster vaccine.** / 1 dose for adults aged 96 years or older.  Pneumococcal  13-valent conjugate (PCV13) vaccine.** / Consult your health care provider.  Pneumococcal polysaccharide (PPSV23) vaccine.** / 1 dose for all adults aged 68 years and older.  Meningococcal vaccine.** / Consult your health care provider.  Hepatitis A vaccine.** / Consult your health care provider.  Hepatitis B vaccine.** / Consult your health care provider.  Haemophilus influenzae type b (Hib) vaccine.** / Consult your health care provider. ** Family history and personal history of risk and conditions may change your health care provider's recommendations. Document Released: 12/13/2001 Document Revised: 03/03/2014 Document Reviewed: 03/14/2011 [facility] Patient Information 2015 [place], [place]. This information is not intended to replace advice given to you by your health care provider. Make sure you discuss any questions you have with your health care provider.

## 2015-05-21 NOTE — Progress Notes (Signed)
Subjective:   Kelly Wade is a 69 y.o. female who presents for an Initial Medicare Annual Wellness Visit. . Review of Systems     Review of Systems  Constitutional: Negative for activity change, appetite change and fatigue.  HENT: Negative for hearing loss, congestion, tinnitus and ear discharge.   Eyes: Negative for visual disturbance (opth- due-- will see in [place]) Respiratory: Negative for cough, chest tightness and shortness of breath.   Cardiovascular: Negative for chest pain, palpitations and leg swelling.  Gastrointestinal: Negative for abdominal pain, diarrhea, constipation and abdominal distention.  Genitourinary: Negative for urgency, frequency, decreased urine volume and difficulty urinating.  Musculoskeletal: Negative for back pain, arthralgias and gait problem.  Skin: Negative for color change, pallor and rash.  Neurological: Negative for dizziness, light-headedness, numbness and headaches.  Hematological: Negative for adenopathy. Does not bruise/bleed easily.  Psychiatric/Behavioral: Negative for suicidal ideas, confusion, sleep disturbance, self-injury, dysphoric mood, decreased concentration and agitation.  Pt is able to read and write and can do all ADLs No risk for falling No abuse/ violence in home           Objective:    Today's Vitals   05/21/15 0942  BP: 126/74  Pulse: 49  Temp: 97.8 F (36.6 C)  TempSrc: Oral  Height: 5' 3.75" (1.619 m)  Weight: 129 lb 3.2 oz (58.605 kg)  SpO2: 100%   BP 126/74 mmHg  Pulse 49  Temp(Src) 97.8 F (36.6 C) (Oral)  Ht 5' 3.75" (1.619 m)  Wt 129 lb 3.2 oz (58.605 kg)  BMI 22.36 kg/m2  SpO2 100% General appearance: alert, cooperative, appears stated age and no distress Head: Normocephalic, without obvious abnormality, atraumatic Eyes: conjunctivae/corneas clear. PERRL, EOM's intact. Fundi benign. Ears: normal TM's and external ear canals both ears Nose: Nares normal. Septum midline. Mucosa normal. No drainage  or sinus tenderness. Throat: lips, mucosa, and tongue normal; teeth and gums normal Neck: no adenopathy, no carotid bruit, no JVD, supple, symmetrical, trachea midline and thyroid not enlarged, symmetric, no tenderness/mass/nodules Back: symmetric, no curvature. ROM normal. No CVA tenderness. Lungs: clear to auscultation bilaterally Breasts: normal appearance, no masses or tenderness Heart: regular rate and rhythm, S1, S2 normal, no murmur, click, rub or gallop Abdomen: soft, non-tender; bowel sounds normal; no masses,  no organomegaly Pelvic: cervix normal in appearance, external genitalia normal, no adnexal masses or tenderness, no cervical motion tenderness, rectovaginal septum normal, uterus normal size, shape, and consistency, vagina normal without discharge and pap done, rectal heme neg brown stool Extremities: extremities normal, atraumatic, no cyanosis or edema Pulses: 2+ and symmetric Skin: Skin color, texture, turgor normal. No rashes or lesions Lymph nodes: Cervical, supraclavicular, and axillary nodes normal. Neurologic: Alert and oriented X 3, normal strength and tone. Normal symmetric reflexes. Normal coordination and gait Psych- no depression, no anxiety Current Medications (verified) Outpatient Encounter Prescriptions as of 05/21/2015  Medication Sig  . aspirin 81 MG tablet Take 81 mg by mouth daily.  Marland Kitchen atorvastatin (LIPITOR) 10 MG tablet Take 1 tablet (10 mg total) by mouth daily.  . Calcium Carbonate-Vit D-Min (CALCIUM 1200 PO) Take 2 tablets by mouth daily.  . Multiple Vitamins-Minerals (MULTIVITAMIN WITH MINERALS) tablet Take 1 tablet by mouth daily.  . [DISCONTINUED] HYDROcodone-acetaminophen (NORCO) 5-325 MG per tablet Take 1 tablet by mouth every 4 (four) hours as needed.  . [DISCONTINUED] OVER THE COUNTER MEDICATION Take 1 tablet by mouth daily. FLex Seed Oil  . [DISCONTINUED] predniSONE (DELTASONE) 20 MG tablet Take 1 tablet (20  mg total) by mouth 2 (two) times daily.     No facility-administered encounter medications on file as of 05/21/2015.    Allergies (verified) Review of patient's allergies indicates no known allergies.   History: Past Medical History  Diagnosis Date  . Hyperlipidemia   . Back pain    Past Surgical History  Procedure Laterality Date  . Back surgery      In [place]  . Cataract extraction, bilateral     Family History  Problem Relation Age of Onset  . Heart disease Father   . Heart disease Brother    Social History   Occupational History  . Not on file.   Social History Main Topics  . Smoking status: Never Smoker   . Smokeless tobacco: Never Used  . Alcohol Use: No  . Drug Use: No  . Sexual Activity: Yes    Tobacco Counseling Counseling given: Not Answered   Activities of Daily Living In your present state of health, do you have any difficulty performing the following activities: 07/01/2014  Hearing? N  Vision? N  Difficulty concentrating or making decisions? N  Walking or climbing stairs? N  Dressing or bathing? N  Doing errands, shopping? N    Immunizations and Health Maintenance Immunization History  Administered Date(s) Administered  . Influenza,inj,Quad PF,36+ Mos 09/18/2014  . Pneumococcal Polysaccharide-23 07/01/2014   Health Maintenance Due  Topic Date Due  . TETANUS/TDAP  08/11/1967  . COLONOSCOPY  08/10/1998  . ZOSTAVAX  08/10/2008    Patient Care Team: Rosalita Chessman, DO as PCP - General (Family Medicine)  Indicate any recent Medical Services you may have received from other than [facility] providers in the past year (date may be approximate).     Assessment:   This is a routine wellness examination for Kelly Wade.   Hearing/Vision screen No exam data present  Dietary issues and exercise activities discussed:    Goals    None     Depression Screen PHQ 2/9 Scores 05/21/2015 07/01/2014  PHQ - 2 Score 0 0    Fall Risk Fall Risk  05/21/2015 07/01/2014 07/01/2014  Falls in the past year? No  No No    Cognitive Function: No flowsheet data found.  Screening Tests Health Maintenance  Topic Date Due  . TETANUS/TDAP  08/11/1967  . COLONOSCOPY  08/10/1998  . ZOSTAVAX  08/10/2008  . INFLUENZA VACCINE  06/01/2015  . PNA vac Low Risk Adult (2 of 2 - PCV13) 07/02/2015  . MAMMOGRAM  03/18/2017  . DEXA SCAN  Completed      Plan:    see AVS ghm utd To see opth in [place]  During the course of the visit, Kelly Wade was educated and counseled about the following appropriate screening and preventive services:   Vaccines to include Pneumoccal, Influenza, Hepatitis B, Td, Zostavax, HCV  Electrocardiogram  Cardiovascular disease screening  Colorectal cancer screening  Bone density screening  Diabetes screening  Glaucoma screening  Mammography/PAP  Nutrition counseling  Smoking cessation counseling  Patient Instructions (the written plan) were given to the patient.   1. Preventative health care chek labs See avs - Ambulatory referral to Gastroenterology  2. Hyperlipidemia LDL goal <100 con't lipitor - Basic metabolic panel - CBC with Differential/Platelet - Hepatic function panel - Lipid panel - POCT urinalysis dipstick  3. Medicare annual wellness visit, initial   4. Routine history and physical examination of adult   5. Screening for malignant neoplasm of cervix  - Cytology - PAP  Kelly Wade  [place], DO   05/21/2015

## 2015-05-22 LAB — CYTOLOGY - PAP

## 2015-08-18 ENCOUNTER — Encounter: Payer: Self-pay | Admitting: Gastroenterology

## 2015-10-07 ENCOUNTER — Ambulatory Visit (AMBULATORY_SURGERY_CENTER): Payer: Self-pay

## 2015-10-07 VITALS — Ht 64.0 in | Wt 132.6 lb

## 2015-10-07 DIAGNOSIS — Z1211 Encounter for screening for malignant neoplasm of colon: Secondary | ICD-10-CM

## 2015-10-07 NOTE — Progress Notes (Signed)
No allergies to eggs or soy No diet/weight loss meds No home oxygen No past problems with anesthesia  Has email and internet; registered for emmi

## 2015-10-21 ENCOUNTER — Encounter: Payer: Self-pay | Admitting: Gastroenterology

## 2015-10-21 ENCOUNTER — Ambulatory Visit (AMBULATORY_SURGERY_CENTER): Payer: Commercial Managed Care - HMO | Admitting: Gastroenterology

## 2015-10-21 VITALS — BP 117/74 | HR 52 | Temp 97.8°F | Resp 15 | Ht 64.0 in | Wt 132.0 lb

## 2015-10-21 DIAGNOSIS — Z1211 Encounter for screening for malignant neoplasm of colon: Secondary | ICD-10-CM

## 2015-10-21 DIAGNOSIS — D125 Benign neoplasm of sigmoid colon: Secondary | ICD-10-CM

## 2015-10-21 DIAGNOSIS — D122 Benign neoplasm of ascending colon: Secondary | ICD-10-CM

## 2015-10-21 HISTORY — PX: COLONOSCOPY: SHX174

## 2015-10-21 MED ORDER — SODIUM CHLORIDE 0.9 % IV SOLN: 500.0000 mL | INTRAVENOUS | Status: DC

## 2015-10-21 NOTE — Patient Instructions (Signed)
YOU HAD AN ENDOSCOPIC PROCEDURE TODAY AT THE [facility]:   Refer to the procedure report that was given to you for any specific questions about what was found during the examination.  If the procedure report does not answer your questions, please call your gastroenterologist to clarify.  If you requested that your care partner not be given the details of your procedure findings, then the procedure report has been included in a sealed envelope for you to review at your convenience later.  YOU SHOULD EXPECT: Some feelings of bloating in the abdomen. Passage of more gas than usual.  Walking can help get rid of the air that was put into your GI tract during the procedure and reduce the bloating. If you had a lower endoscopy (such as a colonoscopy or flexible sigmoidoscopy) you may notice spotting of blood in your stool or on the toilet paper. If you underwent a bowel prep for your procedure, you may not have a normal bowel movement for a few days.  Please Note:  You might notice some irritation and congestion in your nose or some drainage.  This is from the oxygen used during your procedure.  There is no need for concern and it should clear up in a day or so.  SYMPTOMS TO REPORT IMMEDIATELY:   Following lower endoscopy (colonoscopy or flexible sigmoidoscopy):  Excessive amounts of blood in the stool  Significant tenderness or worsening of abdominal pains  Swelling of the abdomen that is new, acute  Fever of 100F or higher   For urgent or emergent issues, a gastroenterologist can be reached at any hour by calling [phone].   DIET: Your first meal following the procedure should be a small meal and then it is ok to progress to your normal diet. Heavy or fried foods are harder to digest and may make you feel nauseous or bloated.  Likewise, meals heavy in dairy and vegetables can increase bloating.  Drink plenty of fluids but you should avoid alcoholic beverages for 24  hours.  ACTIVITY:  You should plan to take it easy for the rest of today and you should NOT DRIVE or use heavy machinery until tomorrow (because of the sedation medicines used during the test).    FOLLOW UP: Our staff will call the number listed on your records the next business day following your procedure to check on you and address any questions or concerns that you may have regarding the information given to you following your procedure. If we do not reach you, we will leave a message.  However, if you are feeling well and you are not experiencing any problems, there is no need to return our call.  We will assume that you have returned to your regular daily activities without incident.  If any biopsies were taken you will be contacted by phone or by letter within the next 1-3 weeks.  Please call us at [phone] if you have not heard about the biopsies in 3 weeks.    SIGNATURES/CONFIDENTIALITY: You and/or your care partner have signed paperwork which will be entered into your electronic medical record.  These signatures attest to the fact that that the information above on your After Visit Summary has been reviewed and is understood.  Full responsibility of the confidentiality of this discharge information lies with you and/or your care-partner.  Polyp information given.  

## 2015-10-21 NOTE — Op Note (Signed)
[facility]  [address] [place], [address]   COLONOSCOPY PROCEDURE REPORT  PATIENT: Gill, Sabat  MR#: [id number] BIRTHDATE: 26-Nov-1945 , 50  yrs. old GENDER: female ENDOSCOPIST: Ladene Artist, MD, [facility] REFERRED BY:  Garnet Koyanagi, DO PROCEDURE DATE:  10/21/2015 PROCEDURE:   Colonoscopy, screening, Colonoscopy with biopsy, and Colonoscopy with snare polypectomy First Screening Colonoscopy - Avg.  risk and is 50 yrs.  old or older Yes.  Prior Negative Screening - Now for repeat screening. N/A  History of Adenoma - Now for follow-up colonoscopy & has been > or = to 3 yrs.  N/A  Polyps removed today? Yes ASA CLASS:   Class II INDICATIONS:Screening for colonic neoplasia and Colorectal Neoplasm Risk Assessment for this procedure is average risk. MEDICATIONS: Monitored anesthesia care and Propofol 200 mg IV DESCRIPTION OF PROCEDURE:   After the risks benefits and alternatives of the procedure were thoroughly explained, informed consent was obtained.  The digital rectal exam revealed no abnormalities of the rectum.   The LB [id number]  endoscope was introduced through the anus and advanced to the cecum, which was identified by both the appendix and ileocecal valve. No adverse events experienced.   The quality of the prep was good.  (Suprep was used)  The instrument was then slowly withdrawn as the colon was fully examined. Estimated blood loss is zero unless otherwise noted in this procedure report.    COLON FINDINGS: A sessile polyp measuring 4 mm in size was found in the ascending colon.  A polypectomy was performed with cold forceps.  The resection was complete, the polyp tissue was completely retrieved and sent to histology.   A sessile polyp measuring 6 mm in size was found in the sigmoid colon.  A polypectomy was performed with a cold snare.  The resection was complete, the polyp tissue was completely retrieved and sent to histology.   The  examination was otherwise normal.  Retroflexed views revealed no abnormalities. The time to cecum = 1.9 Withdrawal time = 13.3   The scope was withdrawn and the procedure completed. COMPLICATIONS: There were no immediate complications.  ENDOSCOPIC IMPRESSION: 1.   Sessile polyp in the ascending colon; polypectomy was performed with cold forceps 2.   Sessile polyp in the sigmoid colon; polypectomy was performed with a cold snare  RECOMMENDATIONS: 1.  Await pathology results 2.  Repeat colonoscopy in 5 years if polyp(s) adenomatous; otherwise 10 years  eSigned:  Ladene Artist, MD, [facility] 10/21/2015 9:09 AM

## 2015-10-21 NOTE — Progress Notes (Signed)
Called to room to assist during endoscopic procedure.  Patient ID and intended procedure confirmed with present staff. Received instructions for my participation in the procedure from the performing physician.  

## 2015-10-21 NOTE — Progress Notes (Signed)
Report to PACU, RN, vss, BBS= Clear.  

## 2015-10-22 ENCOUNTER — Telehealth: Payer: Self-pay | Admitting: *Deleted

## 2015-10-22 NOTE — Telephone Encounter (Signed)
  Follow up Call-  Call back number 10/21/2015  Post procedure Call Back phone  # [phone]  Permission to leave phone message Yes     Patient questions:  Do you have a fever, pain , or abdominal swelling? No. Pain Score  0 *  Have you tolerated food without any problems? Yes.    Have you been able to return to your normal activities? Yes.    Do you have any questions about your discharge instructions: Diet   No. Medications  No. Follow up visit  No.  Do you have questions or concerns about your Care? No.  Actions: * If pain score is 4 or above: No action needed, pain <4.

## 2015-11-02 ENCOUNTER — Encounter: Payer: Self-pay | Admitting: Gastroenterology

## 2015-11-30 ENCOUNTER — Ambulatory Visit (INDEPENDENT_AMBULATORY_CARE_PROVIDER_SITE_OTHER): Payer: Commercial Managed Care - HMO

## 2015-11-30 DIAGNOSIS — Z23 Encounter for immunization: Secondary | ICD-10-CM

## 2016-04-06 ENCOUNTER — Telehealth: Payer: Self-pay | Admitting: Family Medicine

## 2016-04-06 DIAGNOSIS — Z1231 Encounter for screening mammogram for malignant neoplasm of breast: Secondary | ICD-10-CM

## 2016-04-06 NOTE — Telephone Encounter (Signed)
Caller name: Delrae Alfred Relationship to patient: spouse Can be reached: [phone]   Reason for call: Pt was due for mammogram 02/2016 - please enter order for her to have it done at [facility].

## 2016-04-07 NOTE — Telephone Encounter (Signed)
The order has been placed.   KP

## 2016-04-08 NOTE — Telephone Encounter (Signed)
LM to notify pt °

## 2016-04-13 ENCOUNTER — Telehealth: Payer: Self-pay | Admitting: Family Medicine

## 2016-04-13 NOTE — Telephone Encounter (Signed)
No PA is needed for routine mammogram

## 2016-04-13 NOTE — Telephone Encounter (Signed)
Referral already placed and signed on 04/07/16 by Dr Carollee Herter.  Delsa Sale-- can you see if pt's insurance requires authorization?

## 2016-04-13 NOTE — Telephone Encounter (Signed)
Caller Name: Mr. Hetu  Relation to [id number] Call back number:[phone] Pharmacy:  Reason for call:  'Patient requesting mamo orders please send to [facility] patient scheduled appointment for 04/21/16  [facility] [address], [place], [place] [address] [phone]

## 2016-04-14 NOTE — Telephone Encounter (Signed)
Notified pt's spouse. 

## 2016-04-21 ENCOUNTER — Ambulatory Visit
Admission: RE | Admit: 2016-04-21 | Discharge: 2016-04-21 | Disposition: A | Discharge status: Home / Self Care | Payer: Commercial Managed Care - HMO | Source: Ambulatory Visit | Attending: Family Medicine | Admitting: Family Medicine

## 2016-04-21 DIAGNOSIS — Z1231 Encounter for screening mammogram for malignant neoplasm of breast: Secondary | ICD-10-CM

## 2016-07-11 ENCOUNTER — Encounter: Payer: Self-pay | Admitting: Family Medicine

## 2016-07-11 ENCOUNTER — Ambulatory Visit (INDEPENDENT_AMBULATORY_CARE_PROVIDER_SITE_OTHER): Payer: Commercial Managed Care - HMO | Admitting: Family Medicine

## 2016-07-11 VITALS — BP 113/79 | HR 62 | Temp 97.5°F | Resp 16 | Ht 63.0 in | Wt 126.2 lb

## 2016-07-11 DIAGNOSIS — Z23 Encounter for immunization: Secondary | ICD-10-CM

## 2016-07-11 DIAGNOSIS — E785 Hyperlipidemia, unspecified: Secondary | ICD-10-CM

## 2016-07-11 DIAGNOSIS — Z0000 Encounter for general adult medical examination without abnormal findings: Secondary | ICD-10-CM

## 2016-07-11 LAB — COMPREHENSIVE METABOLIC PANEL
ALT: 27 U/L (ref 0–35)
AST: 27 U/L (ref 0–37)
Albumin: 4 g/dL (ref 3.5–5.2)
Alkaline Phosphatase: 70 U/L (ref 39–117)
BUN: 12 mg/dL (ref 6–23)
CO2: 28 mEq/L (ref 19–32)
Calcium: 9.4 mg/dL (ref 8.4–10.5)
Chloride: 105 mEq/L (ref 96–112)
Creatinine, Ser: 0.76 mg/dL (ref 0.40–1.20)
GFR: 80.46 mL/min (ref >60.00)
Glucose, Bld: 100 mg/dL — ABNORMAL HIGH (ref 70–99)
Potassium: 4.9 mEq/L (ref 3.5–5.1)
Sodium: 138 mEq/L (ref 135–145)
Total Bilirubin: 0.5 mg/dL (ref 0.2–1.2)
Total Protein: 7.4 g/dL (ref 6.0–8.3)

## 2016-07-11 LAB — POCT URINALYSIS DIPSTICK
Bilirubin, UA: NEGATIVE
Blood, UA: NEGATIVE
Glucose, UA: NEGATIVE
Ketones, UA: NEGATIVE
Leukocytes, UA: NEGATIVE
Nitrite, UA: NEGATIVE
Protein, UA: NEGATIVE
Spec Grav, UA: >=1.03
Urobilinogen, UA: 0.2
pH, UA: 6

## 2016-07-11 LAB — LIPID PANEL
Cholesterol: 153 mg/dL (ref 0–200)
HDL: 58.9 mg/dL (ref >39.00)
LDL Cholesterol: 78 mg/dL (ref 0–99)
NonHDL: 94.22
Total CHOL/HDL Ratio: 3
Triglycerides: 80 mg/dL (ref 0.0–149.0)
VLDL: 16 mg/dL (ref 0.0–40.0)

## 2016-07-11 LAB — CBC WITH DIFFERENTIAL/PLATELET
Basophils Absolute: 0 10*3/uL (ref 0.0–0.1)
Basophils Relative: 0.5 % (ref 0.0–3.0)
Eosinophils Absolute: 0.1 10*3/uL (ref 0.0–0.7)
Eosinophils Relative: 1.5 % (ref 0.0–5.0)
HCT: 38.8 % (ref 36.0–46.0)
Hemoglobin: 13.1 g/dL (ref 12.0–15.0)
Lymphocytes Relative: 37.5 % (ref 12.0–46.0)
Lymphs Abs: 2.4 10*3/uL (ref 0.7–4.0)
MCHC: 33.6 g/dL (ref 30.0–36.0)
MCV: 85.1 fl (ref 78.0–100.0)
Monocytes Absolute: 0.5 10*3/uL (ref 0.1–1.0)
Monocytes Relative: 7.5 % (ref 3.0–12.0)
Neutro Abs: 3.4 10*3/uL (ref 1.4–7.7)
Neutrophils Relative %: 53 % (ref 43.0–77.0)
Platelets: 236 10*3/uL (ref 150.0–400.0)
RBC: 4.56 Mil/uL (ref 3.87–5.11)
RDW: 14.2 % (ref 11.5–15.5)
WBC: 6.4 10*3/uL (ref 4.0–10.5)

## 2016-07-11 MED ORDER — ATORVASTATIN CALCIUM 10 MG PO TABS: 10.0000 mg | ORAL_TABLET | Freq: Every day | ORAL | 1 refills | Status: AC

## 2016-07-11 NOTE — Progress Notes (Signed)
Subjective:   Kelly Wade is a 70 y.o. female who presents for Medicare Annual (Subsequent) preventive examination.  Review of Systems:   Review of Systems  Constitutional: Negative for activity change, appetite change and fatigue.  HENT: Negative for hearing loss, congestion, tinnitus and ear discharge.   Eyes: Negative for visual disturbance (see optho q1y -- vision corrected to 20/20 with glasses).  Respiratory: Negative for cough, chest tightness and shortness of breath.   Cardiovascular: Negative for chest pain, palpitations and leg swelling.  Gastrointestinal: Negative for abdominal pain, diarrhea, constipation and abdominal distention.  Genitourinary: Negative for urgency, frequency, decreased urine volume and difficulty urinating.  Musculoskeletal: Negative for back pain, arthralgias and gait problem.  Skin: Negative for color change, pallor and rash.  Neurological: Negative for dizziness, light-headedness, numbness and headaches.  Hematological: Negative for adenopathy. Does not bruise/bleed easily.  Psychiatric/Behavioral: Negative for suicidal ideas, confusion, sleep disturbance, self-injury, dysphoric mood, decreased concentration and agitation.  Pt is able to read and write and can do all ADLs No risk for falling No abuse/ violence in home          Objective:     Vitals: BP 113/79 (BP Location: Right Arm, Patient Position: Sitting, Cuff Size: Normal)   Pulse 62   Temp 97.5 F (36.4 C) (Oral)   Resp 16   Ht 5\' 3"  (1.6 m)   Wt 126 lb 3.2 oz (57.2 kg)   SpO2 100%   BMI 22.36 kg/m   Body mass index is 22.36 kg/m. BP 113/79 (BP Location: Right Arm, Patient Position: Sitting, Cuff Size: Normal)   Pulse 62   Temp 97.5 F (36.4 C) (Oral)   Resp 16   Ht 5\' 3"  (1.6 m)   Wt 126 lb 3.2 oz (57.2 kg)   SpO2 100%   BMI 22.36 kg/m  General appearance: alert, cooperative, appears stated age and no distress Head: Normocephalic, without obvious abnormality,  atraumatic Eyes: conjunctivae/corneas clear. PERRL, EOM's intact. Fundi benign. Ears: normal TM's and external ear canals both ears Nose: Nares normal. Septum midline. Mucosa normal. No drainage or sinus tenderness. Throat: lips, mucosa, and tongue normal; teeth and gums normal Neck: no adenopathy, no carotid bruit, no JVD, supple, symmetrical, trachea midline and thyroid not enlarged, symmetric, no tenderness/mass/nodules Back: symmetric, no curvature. ROM normal. No CVA tenderness. Lungs: clear to auscultation bilaterally Breasts: normal appearance, no masses or tenderness Heart: regular rate and rhythm, S1, S2 normal, no murmur, click, rub or gallop Abdomen: soft, non-tender; bowel sounds normal; no masses,  no organomegaly Extremities: extremities normal, atraumatic, no cyanosis or edema Pulses: 2+ and symmetric Skin: Skin color, texture, turgor normal. No rashes or lesions Lymph nodes: Cervical, supraclavicular, and axillary nodes normal. Neurologic: Alert and oriented X 3, normal strength and tone. Normal symmetric reflexes. Normal coordination and gait Tobacco History  Smoking Status  . Never Smoker  Smokeless Tobacco  . Never Used     Counseling given: Not Answered   Past Medical History:  Diagnosis Date  . Back pain   . Hyperlipidemia    Past Surgical History:  Procedure Laterality Date  . BACK SURGERY     In [place]  . CATARACT EXTRACTION, BILATERAL     Family History  Problem Relation Age of Onset  . Heart disease Father   . Heart disease Brother   . Colon cancer Neg Hx    History  Sexual Activity  . Sexual activity: Yes    Outpatient Encounter Prescriptions as of 07/11/2016  Medication Sig  . aspirin 81 MG tablet Take 81 mg by mouth daily.  Marland Kitchen atorvastatin (LIPITOR) 10 MG tablet Take 1 tablet (10 mg total) by mouth daily.  . Calcium Carbonate-Vit D-Min (CALCIUM 1200 PO) Take 2 tablets by mouth daily.  . ferrous fumarate (HEMOCYTE - 106 MG FE) 325 (106 FE)  MG TABS tablet Take 1 tablet by mouth.  . Multiple Vitamins-Minerals (MULTIVITAMIN WITH MINERALS) tablet Take 1 tablet by mouth daily.  . [DISCONTINUED] atorvastatin (LIPITOR) 10 MG tablet Take 1 tablet (10 mg total) by mouth daily.   No facility-administered encounter medications on file as of 07/11/2016.     Activities of Daily Living In your present state of health, do you have any difficulty performing the following activities: 07/11/2016  Hearing? N  Vision? N  Difficulty concentrating or making decisions? N  Walking or climbing stairs? N  Dressing or bathing? N  Doing errands, shopping? N  Some recent data might be hidden    Patient Care Team: Ann Held, DO as PCP - General (Family Medicine)    Assessment:    CPE Exercise Activities and Dietary recommendations Current Exercise Habits: Home exercise routine, Type of exercise: walking, Time (Minutes): 60, Frequency (Times/Week): 7, Weekly Exercise (Minutes/Week): 420, Intensity: Mild, Exercise limited by: None identified  Goals    None     Fall Risk Fall Risk  07/11/2016 05/21/2015 07/01/2014 07/01/2014  Falls in the past year? No No No No   Depression Screen PHQ 2/9 Scores 07/11/2016 07/11/2016 05/21/2015 07/01/2014  PHQ - 2 Score 0 0 0 0     Cognitive Testing MMSE - Mini Mental State Exam 07/11/2016  Orientation to time 5  Orientation to Place 5  Registration 3  Attention/ Calculation 5  Recall 3  Language- name 2 objects 2  Language- repeat 1  Language- follow 3 step command 3  Language- read & follow direction 1  Write a sentence 1  Copy design 1  Total score 30    Immunization History  Administered Date(s) Administered  . Influenza, High Dose Seasonal PF 11/30/2015, 07/11/2016  . Influenza,inj,Quad PF,36+ Mos 09/18/2014  . Pneumococcal Conjugate-13 07/11/2016  . Pneumococcal Polysaccharide-23 07/01/2014   Screening Tests Health Maintenance  Topic Date Due  . Hepatitis C Screening  [birth year]  .  TETANUS/TDAP  08/11/1967  . ZOSTAVAX  08/10/2008  . PNA vac Low Risk Adult (2 of 2 - PCV13) 07/02/2015  . INFLUENZA VACCINE  07/11/2017 (Originally 05/31/2016)  . MAMMOGRAM  04/21/2018  . COLONOSCOPY  10/20/2020  . DEXA SCAN  Completed      Plan:   see AVS During the course of the visit the patient was educated and counseled about the following appropriate screening and preventive services:   Vaccines to include Pneumoccal, Influenza, Hepatitis B, Td, Zostavax, HCV  Electrocardiogram  Cardiovascular Disease  Colorectal cancer screening  Bone density screening  Diabetes screening  Glaucoma screening  Mammography/PAP  Nutrition counseling  1. Hyperlipidemia On lipitor - CBC with Differential/Platelet - Lipid panel - Comprehensive metabolic panel - POCT urinalysis dipstick  2. Preventative health care See above  3. Routine history and physical examination of adult    4. Encounter for immunization   - Flu vaccine HIGH DOSE PF Ann Held, DO  07/11/2016

## 2016-07-11 NOTE — Progress Notes (Signed)
Pre visit review using our clinic review tool, if applicable. No additional management support is needed unless otherwise documented below in the visit note. 

## 2016-07-11 NOTE — Patient Instructions (Signed)
Preventive Care for Adults, Female A healthy lifestyle and preventive care can promote health and wellness. Preventive health guidelines for women include the following key practices.  A routine yearly physical is a good way to check with your health care provider about your health and preventive screening. It is a chance to share any concerns and updates on your health and to receive a thorough exam.  Visit your dentist for a routine exam and preventive care every 6 months. Brush your teeth twice a day and floss once a day. Good oral hygiene prevents tooth decay and gum disease.  The frequency of eye exams is based on your age, health, family medical history, use of contact lenses, and other factors. Follow your health care provider's recommendations for frequency of eye exams.  Eat a healthy diet. Foods like vegetables, fruits, whole grains, low-fat dairy products, and lean protein foods contain the nutrients you need without too many calories. Decrease your intake of foods high in solid fats, added sugars, and salt. Eat the right amount of calories for you.Get information about a proper diet from your health care provider, if necessary.  Regular physical exercise is one of the most important things you can do for your health. Most adults should get at least 150 minutes of moderate-intensity exercise (any activity that increases your heart rate and causes you to sweat) each week. In addition, most adults need muscle-strengthening exercises on 2 or more days a week.  Maintain a healthy weight. The body mass index (BMI) is a screening tool to identify possible weight problems. It provides an estimate of body fat based on height and weight. Your health care provider can find your BMI and can help you achieve or maintain a healthy weight.For adults 20 years and older:  A BMI below 18.5 is considered underweight.  A BMI of 18.5 to 24.9 is normal.  A BMI of 25 to 29.9 is considered overweight.  A  BMI of 30 and above is considered obese.  Maintain normal blood lipids and cholesterol levels by exercising and minimizing your intake of saturated fat. Eat a balanced diet with plenty of fruit and vegetables. Blood tests for lipids and cholesterol should begin at age 45 and be repeated every 5 years. If your lipid or cholesterol levels are high, you are over 50, or you are at high risk for heart disease, you may need your cholesterol levels checked more frequently.Ongoing high lipid and cholesterol levels should be treated with medicines if diet and exercise are not working.  If you smoke, find out from your health care provider how to quit. If you do not use tobacco, do not start.  Lung cancer screening is recommended for adults aged 45-80 years who are at high risk for developing lung cancer because of a history of smoking. A yearly low-dose CT scan of the lungs is recommended for people who have at least a 30-pack-year history of smoking and are a current smoker or have quit within the past 15 years. A pack year of smoking is smoking an average of 1 pack of cigarettes a day for 1 year (for example: 1 pack a day for 30 years or 2 packs a day for 15 years). Yearly screening should continue until the smoker has stopped smoking for at least 15 years. Yearly screening should be stopped for people who develop a health problem that would prevent them from having lung cancer treatment.  If you are pregnant, do not drink alcohol. If you are  breastfeeding, be very cautious about drinking alcohol. If you are not pregnant and choose to drink alcohol, do not have more than 1 drink per day. One drink is considered to be 12 ounces (355 mL) of beer, 5 ounces (148 mL) of wine, or 1.5 ounces (44 mL) of liquor.  Avoid use of street drugs. Do not share needles with anyone. Ask for help if you need support or instructions about stopping the use of drugs.  High blood pressure causes heart disease and increases the risk  of stroke. Your blood pressure should be checked at least every 1 to 2 years. Ongoing high blood pressure should be treated with medicines if weight loss and exercise do not work.  If you are 55-79 years old, ask your health care provider if you should take aspirin to prevent strokes.  Diabetes screening is done by taking a blood sample to check your blood glucose level after you have not eaten for a certain period of time (fasting). If you are not overweight and you do not have risk factors for diabetes, you should be screened once every 3 years starting at age 45. If you are overweight or obese and you are 40-70 years of age, you should be screened for diabetes every year as part of your cardiovascular risk assessment.  Breast cancer screening is essential preventive care for women. You should practice "breast self-awareness." This means understanding the normal appearance and feel of your breasts and may include breast self-examination. Any changes detected, no matter how small, should be reported to a health care provider. Women in their 20s and 30s should have a clinical breast exam (CBE) by a health care provider as part of a regular health exam every 1 to 3 years. After age 40, women should have a CBE every year. Starting at age 40, women should consider having a mammogram (breast X-ray test) every year. Women who have a family history of breast cancer should talk to their health care provider about genetic screening. Women at a high risk of breast cancer should talk to their health care providers about having an MRI and a mammogram every year.  Breast cancer gene (BRCA)-related cancer risk assessment is recommended for women who have family members with BRCA-related cancers. BRCA-related cancers include breast, ovarian, tubal, and peritoneal cancers. Having family members with these cancers may be associated with an increased risk for harmful changes (mutations) in the breast cancer genes BRCA1 and  BRCA2. Results of the assessment will determine the need for genetic counseling and BRCA1 and BRCA2 testing.  Your health care provider may recommend that you be screened regularly for cancer of the pelvic organs (ovaries, uterus, and vagina). This screening involves a pelvic examination, including checking for microscopic changes to the surface of your cervix (Pap test). You may be encouraged to have this screening done every 3 years, beginning at age 21.  For women ages 30-65, health care providers may recommend pelvic exams and Pap testing every 3 years, or they may recommend the Pap and pelvic exam, combined with testing for human papilloma virus (HPV), every 5 years. Some types of HPV increase your risk of cervical cancer. Testing for HPV may also be done on women of any age with unclear Pap test results.  Other health care providers may not recommend any screening for nonpregnant women who are considered low risk for pelvic cancer and who do not have symptoms. Ask your health care provider if a screening pelvic exam is right for   you.  If you have had past treatment for cervical cancer or a condition that could lead to cancer, you need Pap tests and screening for cancer for at least 20 years after your treatment. If Pap tests have been discontinued, your risk factors (such as having a new sexual partner) need to be reassessed to determine if screening should resume. Some women have medical problems that increase the chance of getting cervical cancer. In these cases, your health care provider may recommend more frequent screening and Pap tests.  Colorectal cancer can be detected and often prevented. Most routine colorectal cancer screening begins at the age of 50 years and continues through age 75 years. However, your health care provider may recommend screening at an earlier age if you have risk factors for colon cancer. On a yearly basis, your health care provider may provide home test kits to check  for hidden blood in the stool. Use of a small camera at the end of a tube, to directly examine the colon (sigmoidoscopy or colonoscopy), can detect the earliest forms of colorectal cancer. Talk to your health care provider about this at age 50, when routine screening begins. Direct exam of the colon should be repeated every 5-10 years through age 75 years, unless early forms of precancerous polyps or small growths are found.  People who are at an increased risk for hepatitis B should be screened for this virus. You are considered at high risk for hepatitis B if:  You were born in a country where hepatitis B occurs often. Talk with your health care provider about which countries are considered high risk.  Your parents were born in a high-risk country and you have not received a shot to protect against hepatitis B (hepatitis B vaccine).  You have HIV or AIDS.  You use needles to inject street drugs.  You live with, or have sex with, someone who has hepatitis B.  You get hemodialysis treatment.  You take certain medicines for conditions like cancer, organ transplantation, and autoimmune conditions.  Hepatitis C blood testing is recommended for all people born from 1945 through 1965 and any individual with known risks for hepatitis C.  Practice safe sex. Use condoms and avoid high-risk sexual practices to reduce the spread of sexually transmitted infections (STIs). STIs include gonorrhea, chlamydia, syphilis, trichomonas, herpes, HPV, and human immunodeficiency virus (HIV). Herpes, HIV, and HPV are viral illnesses that have no cure. They can result in disability, cancer, and death.  You should be screened for sexually transmitted illnesses (STIs) including gonorrhea and chlamydia if:  You are sexually active and are younger than 24 years.  You are older than 24 years and your health care provider tells you that you are at risk for this type of infection.  Your sexual activity has changed  since you were last screened and you are at an increased risk for chlamydia or gonorrhea. Ask your health care provider if you are at risk.  If you are at risk of being infected with HIV, it is recommended that you take a prescription medicine daily to prevent HIV infection. This is called preexposure prophylaxis (PrEP). You are considered at risk if:  You are sexually active and do not regularly use condoms or know the HIV status of your partner(s).  You take drugs by injection.  You are sexually active with a partner who has HIV.  Talk with your health care provider about whether you are at high risk of being infected with HIV. If   you choose to begin PrEP, you should first be tested for HIV. You should then be tested every 3 months for as long as you are taking PrEP.  Osteoporosis is a disease in which the bones lose minerals and strength with aging. This can result in serious bone fractures or breaks. The risk of osteoporosis can be identified using a bone density scan. Women ages 67 years and over and women at risk for fractures or osteoporosis should discuss screening with their health care providers. Ask your health care provider whether you should take a calcium supplement or vitamin D to reduce the rate of osteoporosis.  Menopause can be associated with physical symptoms and risks. Hormone replacement therapy is available to decrease symptoms and risks. You should talk to your health care provider about whether hormone replacement therapy is right for you.  Use sunscreen. Apply sunscreen liberally and repeatedly throughout the day. You should seek shade when your shadow is shorter than you. Protect yourself by wearing long sleeves, pants, a wide-brimmed hat, and sunglasses year round, whenever you are outdoors.  Once a month, do a whole body skin exam, using a mirror to look at the skin on your back. Tell your health care provider of new moles, moles that have irregular borders, moles that  are larger than a pencil eraser, or moles that have changed in shape or color.  Stay current with required vaccines (immunizations).  Influenza vaccine. All adults should be immunized every year.  Tetanus, diphtheria, and acellular pertussis (Td, Tdap) vaccine. Pregnant women should receive 1 dose of Tdap vaccine during each pregnancy. The dose should be obtained regardless of the length of time since the last dose. Immunization is preferred during the 27th-36th week of gestation. An adult who has not previously received Tdap or who does not know her vaccine status should receive 1 dose of Tdap. This initial dose should be followed by tetanus and diphtheria toxoids (Td) booster doses every 10 years. Adults with an unknown or incomplete history of completing a 3-dose immunization series with Td-containing vaccines should begin or complete a primary immunization series including a Tdap dose. Adults should receive a Td booster every 10 years.  Varicella vaccine. An adult without evidence of immunity to varicella should receive 2 doses or a second dose if she has previously received 1 dose. Pregnant females who do not have evidence of immunity should receive the first dose after pregnancy. This first dose should be obtained before leaving the health care facility. The second dose should be obtained 4-8 weeks after the first dose.  Human papillomavirus (HPV) vaccine. Females aged 13-26 years who have not received the vaccine previously should obtain the 3-dose series. The vaccine is not recommended for use in pregnant females. However, pregnancy testing is not needed before receiving a dose. If a female is found to be pregnant after receiving a dose, no treatment is needed. In that case, the remaining doses should be delayed until after the pregnancy. Immunization is recommended for any person with an immunocompromised condition through the age of 61 years if she did not get any or all doses earlier. During the  3-dose series, the second dose should be obtained 4-8 weeks after the first dose. The third dose should be obtained 24 weeks after the first dose and 16 weeks after the second dose.  Zoster vaccine. One dose is recommended for adults aged 30 years or older unless certain conditions are present.  Measles, mumps, and rubella (MMR) vaccine. Adults born  before 1957 generally are considered immune to measles and mumps. Adults born in 1957 or later should have 1 or more doses of MMR vaccine unless there is a contraindication to the vaccine or there is laboratory evidence of immunity to each of the three diseases. A routine second dose of MMR vaccine should be obtained at least 28 days after the first dose for students attending postsecondary schools, health care workers, or international travelers. People who received inactivated measles vaccine or an unknown type of measles vaccine during 1963-1967 should receive 2 doses of MMR vaccine. People who received inactivated mumps vaccine or an unknown type of mumps vaccine before 1979 and are at high risk for mumps infection should consider immunization with 2 doses of MMR vaccine. For females of childbearing age, rubella immunity should be determined. If there is no evidence of immunity, females who are not pregnant should be vaccinated. If there is no evidence of immunity, females who are pregnant should delay immunization until after pregnancy. Unvaccinated health care workers born before 1957 who lack laboratory evidence of measles, mumps, or rubella immunity or laboratory confirmation of disease should consider measles and mumps immunization with 2 doses of MMR vaccine or rubella immunization with 1 dose of MMR vaccine.  Pneumococcal 13-valent conjugate (PCV13) vaccine. When indicated, a person who is uncertain of his immunization history and has no record of immunization should receive the PCV13 vaccine. All adults 65 years of age and older should receive this  vaccine. An adult aged 19 years or older who has certain medical conditions and has not been previously immunized should receive 1 dose of PCV13 vaccine. This PCV13 should be followed with a dose of pneumococcal polysaccharide (PPSV23) vaccine. Adults who are at high risk for pneumococcal disease should obtain the PPSV23 vaccine at least 8 weeks after the dose of PCV13 vaccine. Adults older than 68 years of age who have normal immune system function should obtain the PPSV23 vaccine dose at least 1 year after the dose of PCV13 vaccine.  Pneumococcal polysaccharide (PPSV23) vaccine. When PCV13 is also indicated, PCV13 should be obtained first. All adults aged 65 years and older should be immunized. An adult younger than age 65 years who has certain medical conditions should be immunized. Any person who resides in a nursing home or long-term care facility should be immunized. An adult smoker should be immunized. People with an immunocompromised condition and certain other conditions should receive both PCV13 and PPSV23 vaccines. People with human immunodeficiency virus (HIV) infection should be immunized as soon as possible after diagnosis. Immunization during chemotherapy or radiation therapy should be avoided. Routine use of PPSV23 vaccine is not recommended for American Indians, [place] Natives, or people younger than 65 years unless there are medical conditions that require PPSV23 vaccine. When indicated, people who have unknown immunization and have no record of immunization should receive PPSV23 vaccine. One-time revaccination 5 years after the first dose of PPSV23 is recommended for people aged 19-64 years who have chronic kidney failure, nephrotic syndrome, asplenia, or immunocompromised conditions. People who received 1-2 doses of PPSV23 before age 65 years should receive another dose of PPSV23 vaccine at age 65 years or later if at least 5 years have passed since the previous dose. Doses of PPSV23 are not  needed for people immunized with PPSV23 at or after age 65 years.  Meningococcal vaccine. Adults with asplenia or persistent complement component deficiencies should receive 2 doses of quadrivalent meningococcal conjugate (MenACWY-D) vaccine. The doses should be obtained   at least 2 months apart. Microbiologists working with certain meningococcal bacteria, [place] recruits, people at risk during an outbreak, and people who travel to or live in countries with a high rate of meningitis should be immunized. A first-year college student up through age 34 years who is living in a residence hall should receive a dose if she did not receive a dose on or after her 16th birthday. Adults who have certain high-risk conditions should receive one or more doses of vaccine.  Hepatitis A vaccine. Adults who wish to be protected from this disease, have certain high-risk conditions, work with hepatitis A-infected animals, work in hepatitis A research labs, or travel to or work in countries with a high rate of hepatitis A should be immunized. Adults who were previously unvaccinated and who anticipate close contact with an international adoptee during the first 60 days after arrival in the [place] from a country with a high rate of hepatitis A should be immunized.  Hepatitis B vaccine. Adults who wish to be protected from this disease, have certain high-risk conditions, may be exposed to blood or other infectious body fluids, are household contacts or sex partners of hepatitis B positive people, are clients or workers in certain care facilities, or travel to or work in countries with a high rate of hepatitis B should be immunized.  Haemophilus influenzae type b (Hib) vaccine. A previously unvaccinated person with asplenia or sickle cell disease or having a scheduled splenectomy should receive 1 dose of Hib vaccine. Regardless of previous immunization, a recipient of a hematopoietic stem cell transplant should receive a  3-dose series 6-12 months after her successful transplant. Hib vaccine is not recommended for adults with HIV infection. Preventive Services / Frequency Ages 35 to 4 years  Blood pressure check.** / Every 3-5 years.  Lipid and cholesterol check.** / Every 5 years beginning at age 60.  Clinical breast exam.** / Every 3 years for women in their 71s and 10s.  BRCA-related cancer risk assessment.** / For women who have family members with a BRCA-related cancer (breast, ovarian, tubal, or peritoneal cancers).  Pap test.** / Every 2 years from ages 76 through 26. Every 3 years starting at age 61 through age 76 or 93 with a history of 3 consecutive normal Pap tests.  HPV screening.** / Every 3 years from ages 37 through ages 60 to 51 with a history of 3 consecutive normal Pap tests.  Hepatitis C blood test.** / For any individual with known risks for hepatitis C.  Skin self-exam. / Monthly.  Influenza vaccine. / Every year.  Tetanus, diphtheria, and acellular pertussis (Tdap, Td) vaccine.** / Consult your health care provider. Pregnant women should receive 1 dose of Tdap vaccine during each pregnancy. 1 dose of Td every 10 years.  Varicella vaccine.** / Consult your health care provider. Pregnant females who do not have evidence of immunity should receive the first dose after pregnancy.  HPV vaccine. / 3 doses over 6 months, if 93 and younger. The vaccine is not recommended for use in pregnant females. However, pregnancy testing is not needed before receiving a dose.  Measles, mumps, rubella (MMR) vaccine.** / You need at least 1 dose of MMR if you were born in 1957 or later. You may also need a 2nd dose. For females of childbearing age, rubella immunity should be determined. If there is no evidence of immunity, females who are not pregnant should be vaccinated. If there is no evidence of immunity, females who are  pregnant should delay immunization until after pregnancy.  Pneumococcal  13-valent conjugate (PCV13) vaccine.** / Consult your health care provider.  Pneumococcal polysaccharide (PPSV23) vaccine.** / 1 to 2 doses if you smoke cigarettes or if you have certain conditions.  Meningococcal vaccine.** / 1 dose if you are age 68 to 8 years and a Market researcher living in a residence hall, or have one of several medical conditions, you need to get vaccinated against meningococcal disease. You may also need additional booster doses.  Hepatitis A vaccine.** / Consult your health care provider.  Hepatitis B vaccine.** / Consult your health care provider.  Haemophilus influenzae type b (Hib) vaccine.** / Consult your health care provider. Ages 7 to 53 years  Blood pressure check.** / Every year.  Lipid and cholesterol check.** / Every 5 years beginning at age 25 years.  Lung cancer screening. / Every year if you are aged 11-80 years and have a 30-pack-year history of smoking and currently smoke or have quit within the past 15 years. Yearly screening is stopped once you have quit smoking for at least 15 years or develop a health problem that would prevent you from having lung cancer treatment.  Clinical breast exam.** / Every year after age 48 years.  BRCA-related cancer risk assessment.** / For women who have family members with a BRCA-related cancer (breast, ovarian, tubal, or peritoneal cancers).  Mammogram.** / Every year beginning at age 41 years and continuing for as long as you are in good health. Consult with your health care provider.  Pap test.** / Every 3 years starting at age 65 years through age 37 or 70 years with a history of 3 consecutive normal Pap tests.  HPV screening.** / Every 3 years from ages 72 years through ages 60 to 40 years with a history of 3 consecutive normal Pap tests.  Fecal occult blood test (FOBT) of stool. / Every year beginning at age 21 years and continuing until age 5 years. You may not need to do this test if you get  a colonoscopy every 10 years.  Flexible sigmoidoscopy or colonoscopy.** / Every 5 years for a flexible sigmoidoscopy or every 10 years for a colonoscopy beginning at age 35 years and continuing until age 48 years.  Hepatitis C blood test.** / For all people born from [birth year] through 1965 and any individual with known risks for hepatitis C.  Skin self-exam. / Monthly.  Influenza vaccine. / Every year.  Tetanus, diphtheria, and acellular pertussis (Tdap/Td) vaccine.** / Consult your health care provider. Pregnant women should receive 1 dose of Tdap vaccine during each pregnancy. 1 dose of Td every 10 years.  Varicella vaccine.** / Consult your health care provider. Pregnant females who do not have evidence of immunity should receive the first dose after pregnancy.  Zoster vaccine.** / 1 dose for adults aged 30 years or older.  Measles, mumps, rubella (MMR) vaccine.** / You need at least 1 dose of MMR if you were born in 1957 or later. You may also need a second dose. For females of childbearing age, rubella immunity should be determined. If there is no evidence of immunity, females who are not pregnant should be vaccinated. If there is no evidence of immunity, females who are pregnant should delay immunization until after pregnancy.  Pneumococcal 13-valent conjugate (PCV13) vaccine.** / Consult your health care provider.  Pneumococcal polysaccharide (PPSV23) vaccine.** / 1 to 2 doses if you smoke cigarettes or if you have certain conditions.  Meningococcal vaccine.** /  Consult your health care provider.  Hepatitis A vaccine.** / Consult your health care provider.  Hepatitis B vaccine.** / Consult your health care provider.  Haemophilus influenzae type b (Hib) vaccine.** / Consult your health care provider. Ages 64 years and over  Blood pressure check.** / Every year.  Lipid and cholesterol check.** / Every 5 years beginning at age 23 years.  Lung cancer screening. / Every year if you  are aged 16-80 years and have a 30-pack-year history of smoking and currently smoke or have quit within the past 15 years. Yearly screening is stopped once you have quit smoking for at least 15 years or develop a health problem that would prevent you from having lung cancer treatment.  Clinical breast exam.** / Every year after age 74 years.  BRCA-related cancer risk assessment.** / For women who have family members with a BRCA-related cancer (breast, ovarian, tubal, or peritoneal cancers).  Mammogram.** / Every year beginning at age 44 years and continuing for as long as you are in good health. Consult with your health care provider.  Pap test.** / Every 3 years starting at age 58 years through age 22 or 39 years with 3 consecutive normal Pap tests. Testing can be stopped between 65 and 70 years with 3 consecutive normal Pap tests and no abnormal Pap or HPV tests in the past 10 years.  HPV screening.** / Every 3 years from ages 64 years through ages 70 or 61 years with a history of 3 consecutive normal Pap tests. Testing can be stopped between 65 and 70 years with 3 consecutive normal Pap tests and no abnormal Pap or HPV tests in the past 10 years.  Fecal occult blood test (FOBT) of stool. / Every year beginning at age 40 years and continuing until age 27 years. You may not need to do this test if you get a colonoscopy every 10 years.  Flexible sigmoidoscopy or colonoscopy.** / Every 5 years for a flexible sigmoidoscopy or every 10 years for a colonoscopy beginning at age 7 years and continuing until age 32 years.  Hepatitis C blood test.** / For all people born from [birth year] through 1965 and any individual with known risks for hepatitis C.  Osteoporosis screening.** / A one-time screening for women ages 30 years and over and women at risk for fractures or osteoporosis.  Skin self-exam. / Monthly.  Influenza vaccine. / Every year.  Tetanus, diphtheria, and acellular pertussis (Tdap/Td)  vaccine.** / 1 dose of Td every 10 years.  Varicella vaccine.** / Consult your health care provider.  Zoster vaccine.** / 1 dose for adults aged 35 years or older.  Pneumococcal 13-valent conjugate (PCV13) vaccine.** / Consult your health care provider.  Pneumococcal polysaccharide (PPSV23) vaccine.** / 1 dose for all adults aged 46 years and older.  Meningococcal vaccine.** / Consult your health care provider.  Hepatitis A vaccine.** / Consult your health care provider.  Hepatitis B vaccine.** / Consult your health care provider.  Haemophilus influenzae type b (Hib) vaccine.** / Consult your health care provider. ** Family history and personal history of risk and conditions may change your health care provider's recommendations.   This information is not intended to replace advice given to you by your health care provider. Make sure you discuss any questions you have with your health care provider.   Document Released: 12/13/2001 Document Revised: 11/07/2014 Document Reviewed: 03/14/2011 Elsevier Interactive Patient Education [employer].

## 2017-05-12 ENCOUNTER — Telehealth: Payer: Self-pay | Admitting: Family Medicine

## 2017-05-12 DIAGNOSIS — Z1231 Encounter for screening mammogram for malignant neoplasm of breast: Secondary | ICD-10-CM

## 2017-05-12 NOTE — Telephone Encounter (Signed)
Caller name:Jarad Relation to pt: spouse Call back number: [phone] Pharmacy:  Reason for call: Pt's spouse stated pt is needing referral for a Mammogram at the [place] [facility], pt is due for one this year. Please advise.

## 2017-05-12 NOTE — Telephone Encounter (Signed)
noted 

## 2017-05-12 NOTE — Telephone Encounter (Signed)
Last mammo 04/21/2016

## 2017-05-12 NOTE — Telephone Encounter (Signed)
Left message on machine that referral placed and that they do not have to call for screening mammogram they can just call the breast center to do it.

## 2017-05-23 ENCOUNTER — Ambulatory Visit
Admission: RE | Admit: 2017-05-23 | Discharge: 2017-05-23 | Disposition: A | Discharge status: Home / Self Care | Payer: Commercial Managed Care - HMO | Source: Ambulatory Visit | Attending: Family Medicine | Admitting: Family Medicine

## 2017-05-23 DIAGNOSIS — Z1231 Encounter for screening mammogram for malignant neoplasm of breast: Secondary | ICD-10-CM

## 2017-05-29 ENCOUNTER — Encounter: Payer: Self-pay | Admitting: Family Medicine

## 2017-05-29 ENCOUNTER — Ambulatory Visit (INDEPENDENT_AMBULATORY_CARE_PROVIDER_SITE_OTHER): Payer: Medicare PPO | Admitting: Family Medicine

## 2017-05-29 VITALS — BP 102/62 | HR 57 | Temp 97.7°F | Ht 63.0 in | Wt 127.1 lb

## 2017-05-29 DIAGNOSIS — R5383 Other fatigue: Secondary | ICD-10-CM

## 2017-05-29 DIAGNOSIS — Z0000 Encounter for general adult medical examination without abnormal findings: Secondary | ICD-10-CM

## 2017-05-29 DIAGNOSIS — Z862 Personal history of diseases of the blood and blood-forming organs and certain disorders involving the immune mechanism: Secondary | ICD-10-CM | POA: Diagnosis not present

## 2017-05-29 DIAGNOSIS — E785 Hyperlipidemia, unspecified: Secondary | ICD-10-CM | POA: Diagnosis not present

## 2017-05-29 LAB — LIPID PANEL
Cholesterol: 154 mg/dL (ref 0–200)
HDL: 61.3 mg/dL (ref >39.00)
LDL Cholesterol: 73 mg/dL (ref 0–99)
NonHDL: 93.04
Total CHOL/HDL Ratio: 3
Triglycerides: 98 mg/dL (ref 0.0–149.0)
VLDL: 19.6 mg/dL (ref 0.0–40.0)

## 2017-05-29 LAB — CBC WITH DIFFERENTIAL/PLATELET
Basophils Absolute: 0 10*3/uL (ref 0.0–0.1)
Basophils Relative: 0.3 % (ref 0.0–3.0)
Eosinophils Absolute: 0.1 10*3/uL (ref 0.0–0.7)
Eosinophils Relative: 1.2 % (ref 0.0–5.0)
HCT: 40.2 % (ref 36.0–46.0)
Hemoglobin: 13.1 g/dL (ref 12.0–15.0)
Lymphocytes Relative: 39.4 % (ref 12.0–46.0)
Lymphs Abs: 2.4 10*3/uL (ref 0.7–4.0)
MCHC: 32.6 g/dL (ref 30.0–36.0)
MCV: 86.2 fl (ref 78.0–100.0)
Monocytes Absolute: 0.5 10*3/uL (ref 0.1–1.0)
Monocytes Relative: 7.5 % (ref 3.0–12.0)
Neutro Abs: 3.2 10*3/uL (ref 1.4–7.7)
Neutrophils Relative %: 51.6 % (ref 43.0–77.0)
Platelets: 241 10*3/uL (ref 150.0–400.0)
RBC: 4.66 Mil/uL (ref 3.87–5.11)
RDW: 13.6 % (ref 11.5–15.5)
WBC: 6.2 10*3/uL (ref 4.0–10.5)

## 2017-05-29 LAB — TSH: TSH: 5.34 u[IU]/mL — ABNORMAL HIGH (ref 0.35–4.50)

## 2017-05-29 LAB — COMPREHENSIVE METABOLIC PANEL
ALT: 14 U/L (ref 0–35)
AST: 16 U/L (ref 0–37)
Albumin: 3.9 g/dL (ref 3.5–5.2)
Alkaline Phosphatase: 76 U/L (ref 39–117)
BUN: 10 mg/dL (ref 6–23)
CO2: 27 mEq/L (ref 19–32)
Calcium: 9.8 mg/dL (ref 8.4–10.5)
Chloride: 103 mEq/L (ref 96–112)
Creatinine, Ser: 0.82 mg/dL (ref 0.40–1.20)
GFR: 73.51 mL/min (ref >60.00)
Glucose, Bld: 98 mg/dL (ref 70–99)
Potassium: 4 mEq/L (ref 3.5–5.1)
Sodium: 138 mEq/L (ref 135–145)
Total Bilirubin: 0.5 mg/dL (ref 0.2–1.2)
Total Protein: 7.3 g/dL (ref 6.0–8.3)

## 2017-05-29 LAB — POC URINALSYSI DIPSTICK (AUTOMATED)
Bilirubin, UA: NEGATIVE
Blood, UA: NEGATIVE
Glucose, UA: NEGATIVE
Ketones, UA: NEGATIVE
Leukocytes, UA: NEGATIVE
Nitrite, UA: NEGATIVE
Protein, UA: NEGATIVE
Spec Grav, UA: 1.015 (ref 1.010–1.025)
Urobilinogen, UA: 0.2 E.U./dL
pH, UA: 6 (ref 5.0–8.0)

## 2017-05-29 LAB — VITAMIN B12: Vitamin B-12: 133 pg/mL — ABNORMAL LOW (ref 211–911)

## 2017-05-29 LAB — IBC PANEL
Iron: 78 ug/dL (ref 42–145)
Saturation Ratios: 24.2 % (ref 20.0–50.0)
Transferrin: 230 mg/dL (ref 212.0–360.0)

## 2017-05-29 LAB — FERRITIN: Ferritin: 72.4 ng/mL (ref 10.0–291.0)

## 2017-05-29 NOTE — Assessment & Plan Note (Signed)
Tolerating statin, encouraged heart healthy diet, avoid trans fats, minimize simple carbs and saturated fats. Increase exercise as tolerated 

## 2017-05-29 NOTE — Progress Notes (Signed)
Pre visit review using our clinic review tool, if applicable. No additional management support is needed unless otherwise documented below in the visit note. 

## 2017-05-29 NOTE — Progress Notes (Signed)
Subjective:     Kelly Wade is a 71 y.o. female and is here for a comprehensive physical exam. The patient reports problems - fatigue ,  daily .   no other complaints.  .  Social History   Social History  . Marital status: Married    Spouse name: N/A  . Number of children: N/A  . Years of education: N/A   Occupational History  . Not on file.   Social History Main Topics  . Smoking status: Never Smoker  . Smokeless tobacco: Never Used  . Alcohol use No  . Drug use: No  . Sexual activity: Yes   Other Topics Concern  . Not on file   Social History Narrative   Exercise-- walking 1 hour daily,  Yoga at home   Health Maintenance  Topic Date Due  . Hepatitis C Screening  05-Sep-1946  . TETANUS/TDAP  08/11/1967  . INFLUENZA VACCINE  05/31/2017  . MAMMOGRAM  05/24/2019  . COLONOSCOPY  10/20/2020  . DEXA SCAN  Completed  . PNA vac Low Risk Adult  Completed    The following portions of the patient's history were reviewed and updated as appropriate:  She  has a past medical history of Back pain and Hyperlipidemia. She  does not have any pertinent problems on file. She  has a past surgical history that includes Back surgery and Cataract extraction, bilateral. Her family history includes Heart disease in her brother and father. She  reports that she has never smoked. She has never used smokeless tobacco. She reports that she does not drink alcohol or use drugs. She has a current medication list which includes the following prescription(s): aspirin, atorvastatin, calcium carbonate-vit d-min, ferrous fumarate, and multivitamin with minerals. Current Outpatient Prescriptions on File Prior to Visit  Medication Sig Dispense Refill  . aspirin 81 MG tablet Take 81 mg by mouth daily.    Marland Kitchen atorvastatin (LIPITOR) 10 MG tablet Take 1 tablet (10 mg total) by mouth daily. 90 tablet 1  . Calcium Carbonate-Vit D-Min (CALCIUM 1200 PO) Take 2 tablets by mouth daily.    . ferrous fumarate (HEMOCYTE  - 106 MG FE) 325 (106 FE) MG TABS tablet Take 1 tablet by mouth.    . Multiple Vitamins-Minerals (MULTIVITAMIN WITH MINERALS) tablet Take 1 tablet by mouth daily.     No current facility-administered medications on file prior to visit.    She has No Known Allergies..  Review of Systems Review of Systems  Constitutional: Negative for activity change, appetite change  + fatigue . HENT: Negative for hearing loss, congestion, tinnitus and ear discharge.  dentist q49m Eyes: Negative for visual disturbance (see optho q1y -- vision corrected to 20/20 with glasses).  Respiratory: Negative for cough, chest tightness and shortness of breath.   Cardiovascular: Negative for chest pain, palpitations and leg swelling.  Gastrointestinal: Negative for abdominal pain, diarrhea, constipation and abdominal distention.  Genitourinary: Negative for urgency, frequency, decreased urine volume and difficulty urinating.  Musculoskeletal: Negative for back pain, arthralgias and gait problem.  Skin: Negative for color change, pallor and rash.  Neurological: Negative for dizziness, light-headedness, numbness and headaches.  Hematological: Negative for adenopathy. Does not bruise/bleed easily.  Psychiatric/Behavioral: Negative for suicidal ideas, confusion, sleep disturbance, self-injury, dysphoric mood, decreased concentration and agitation.       Objective:    BP 102/62 (BP Location: Right Arm, Patient Position: Sitting, Cuff Size: Normal)   Pulse (!) 57   Temp 97.7 F (36.5 C) (Oral)  Ht 5\' 3"  (1.6 m)   Wt 127 lb 2 oz (57.7 kg)   SpO2 96%   BMI 22.52 kg/m  General appearance: alert, cooperative, appears stated age and no distress Head: Normocephalic, without obvious abnormality, atraumatic Eyes: conjunctivae/corneas clear. PERRL, EOM's intact. Fundi benign. Ears: normal TM's and external ear canals both ears Nose: Nares normal. Septum midline. Mucosa normal. No drainage or sinus tenderness. Throat:  lips, mucosa, and tongue normal; teeth and gums normal Neck: no adenopathy, no carotid bruit, no JVD, supple, symmetrical, trachea midline and thyroid not enlarged, symmetric, no tenderness/mass/nodules Back: symmetric, no curvature. ROM normal. No CVA tenderness. Lungs: clear to auscultation bilaterally Breasts: normal appearance, no masses or tenderness Heart: regular rate and rhythm, S1, S2 normal, no murmur, click, rub or gallop Abdomen: soft, non-tender; bowel sounds normal; no masses,  no organomegaly Pelvic: deferred-- postmenopausal  Extremities: extremities normal, atraumatic, no cyanosis or edema Pulses: 2+ and symmetric Skin: Skin color, texture, turgor normal. No rashes or lesions Lymph nodes: Cervical, supraclavicular, and axillary nodes normal. Neurologic: Alert and oriented X 3, normal strength and tone. Normal symmetric reflexes. Normal coordination and gait    Assessment:    Healthy female exam.      Plan:    ghm utd Check labs See After Visit Summary for Counseling Recommendations    1. Hyperlipidemia LDL goal <100 Tolerating statin, encouraged heart healthy diet, avoid trans fats, minimize simple carbs and saturated fats. Increase exercise as tolerated - Lipid panel - CBC with Differential/Platelet - Comprehensive metabolic panel - POCT Urinalysis Dipstick (Automated)  2. Fatigue, unspecified type  - Vitamin B12 - TSH - IBC panel - Ferritin - POCT Urinalysis Dipstick (Automated)  3. History of anemia  - Vitamin B12 - TSH - IBC panel - Ferritin - POCT Urinalysis Dipstick (Automated)  4. Preventative health care See above rto AWV - POCT Urinalysis Dipstick (Automated)

## 2017-05-29 NOTE — Patient Instructions (Signed)
Preventive Care 65 Years and Older, Female Preventive care refers to lifestyle choices and visits with your health care provider that can promote health and wellness. What does preventive care include?  A yearly physical exam. This is also called an annual well check.  Dental exams once or twice a year.  Routine eye exams. Ask your health care provider how often you should have your eyes checked.  Personal lifestyle choices, including: ? Daily care of your teeth and gums. ? Regular physical activity. ? Eating a healthy diet. ? Avoiding tobacco and drug use. ? Limiting alcohol use. ? Practicing safe sex. ? Taking low-dose aspirin every day. ? Taking vitamin and mineral supplements as recommended by your health care provider. What happens during an annual well check? The services and screenings done by your health care provider during your annual well check will depend on your age, overall health, lifestyle risk factors, and family history of disease. Counseling Your health care provider may ask you questions about your:  Alcohol use.  Tobacco use.  Drug use.  Emotional well-being.  Home and relationship well-being.  Sexual activity.  Eating habits.  History of falls.  Memory and ability to understand (cognition).  Work and work environment.  Reproductive health.  Screening You may have the following tests or measurements:  Height, weight, and BMI.  Blood pressure.  Lipid and cholesterol levels. These may be checked every 5 years, or more frequently if you are over 50 years old.  Skin check.  Lung cancer screening. You may have this screening every year starting at age 55 if you have a 30-pack-year history of smoking and currently smoke or have quit within the past 15 years.  Fecal occult blood test (FOBT) of the stool. You may have this test every year starting at age 50.  Flexible sigmoidoscopy or colonoscopy. You may have a sigmoidoscopy every 5 years or  a colonoscopy every 10 years starting at age 50.  Hepatitis C blood test.  Hepatitis B blood test.  Sexually transmitted disease (STD) testing.  Diabetes screening. This is done by checking your blood sugar (glucose) after you have not eaten for a while (fasting). You may have this done every 1-3 years.  Bone density scan. This is done to screen for osteoporosis. You may have this done starting at age 65.  Mammogram. This may be done every 1-2 years. Talk to your health care provider about how often you should have regular mammograms.  Talk with your health care provider about your test results, treatment options, and if necessary, the need for more tests. Vaccines Your health care provider may recommend certain vaccines, such as:  Influenza vaccine. This is recommended every year.  Tetanus, diphtheria, and acellular pertussis (Tdap, Td) vaccine. You may need a Td booster every 10 years.  Varicella vaccine. You may need this if you have not been vaccinated.  Zoster vaccine. You may need this after age 60.  Measles, mumps, and rubella (MMR) vaccine. You may need at least one dose of MMR if you were born in 1957 or later. You may also need a second dose.  Pneumococcal 13-valent conjugate (PCV13) vaccine. One dose is recommended after age 65.  Pneumococcal polysaccharide (PPSV23) vaccine. One dose is recommended after age 65.  Meningococcal vaccine. You may need this if you have certain conditions.  Hepatitis A vaccine. You may need this if you have certain conditions or if you travel or work in places where you may be exposed to hepatitis   A.  Hepatitis B vaccine. You may need this if you have certain conditions or if you travel or work in places where you may be exposed to hepatitis B.  Haemophilus influenzae type b (Hib) vaccine. You may need this if you have certain conditions.  Talk to your health care provider about which screenings and vaccines you need and how often you  need them. This information is not intended to replace advice given to you by your health care provider. Make sure you discuss any questions you have with your health care provider. Document Released: 11/13/2015 Document Revised: 07/06/2016 Document Reviewed: 08/18/2015 Elsevier Interactive Patient Education  2017 [employer].

## 2017-05-31 ENCOUNTER — Encounter: Payer: Self-pay | Admitting: *Deleted

## 2017-06-09 ENCOUNTER — Telehealth: Payer: Self-pay | Admitting: Family Medicine

## 2017-06-09 NOTE — Telephone Encounter (Signed)
Pt's spouse called in. He said that pt was advised to have b-12 injections. Explained to pt that injection is a nurse visit. He said that he would like to get a Rx for B-12 to take to pharmacy.   They would like a call to pick up.   CB: [phone]

## 2017-06-12 NOTE — Telephone Encounter (Signed)
Pt stated would like rx ASAP.

## 2017-06-12 NOTE — Telephone Encounter (Signed)
Ok to write for b12 sq instead of IM to take at home

## 2017-06-12 NOTE — Telephone Encounter (Signed)
Pt came in office requesting if possible for provider to print out a rx for B-12 inj since they have a person that can give the pt the shot, pt is just worried that if she gets a nurse visit and have the B-12 inj done at our office that she will received a bill for procedure and pt is not willing to pay the bill. Please advise.

## 2017-06-12 NOTE — Telephone Encounter (Signed)
Pt's spouse called in. He said that pt was advised to have b-12 injections. Explained to pt that injection is a nurse visit. He said that he would like to get a Rx for B-12 to take to pharmacy.. Please advise. LB

## 2017-06-13 MED ORDER — CYANOCOBALAMIN 1000 MCG/ML IJ SOLN: 1000.0000 ug | INTRAMUSCULAR | 0 refills | Status: DC

## 2017-06-13 NOTE — Telephone Encounter (Signed)
Ok to print b12 rx

## 2017-06-13 NOTE — Telephone Encounter (Signed)
Printed prescription and patient to pickup at the front and take to the pharmacy. The husband is still going to call the insurance company and compare cost of doing at home/or at our office as a nurse visit.

## 2017-06-16 MED ORDER — "SYRINGE/NEEDLE (DISP) 25G X 1"" 3 ML MISC": 0 refills | Status: DC

## 2017-06-16 MED ORDER — CYANOCOBALAMIN 1000 MCG/ML IJ SOLN: INTRAMUSCULAR | 0 refills | Status: DC

## 2017-06-16 NOTE — Telephone Encounter (Signed)
Resent prescription as instructed on lab results.

## 2017-06-16 NOTE — Telephone Encounter (Signed)
Sent in needles 

## 2017-06-16 NOTE — Addendum Note (Signed)
Addended by: Sharon Seller B on: 06/16/2017 04:25 PM   Modules accepted: Orders

## 2017-12-07 ENCOUNTER — Telehealth: Payer: Self-pay | Admitting: Family Medicine

## 2017-12-07 NOTE — Telephone Encounter (Signed)
Copied from [place] [id number]. Topic: Quick Communication - See Telephone Encounter >> Dec 07, 2017  1:12 PM Cleaster Corin, [place] wrote: CRM for notification. See Telephone encounter for:   12/07/17. Pt. Husband Kelly Wade  calling to check to see if wife needs to have blood work done any time soon. Husband can be reached at [phone]

## 2017-12-07 NOTE — Telephone Encounter (Signed)
Patient's husband called asking if wife needs blood work done soon. She was to follow up in January with Dr. Etter Sjogren. Patient needs office visit and will likely have blood work done then.

## 2017-12-08 ENCOUNTER — Ambulatory Visit: Payer: Medicare PPO | Admitting: Family Medicine

## 2017-12-08 ENCOUNTER — Other Ambulatory Visit: Payer: Self-pay | Admitting: Family Medicine

## 2017-12-08 DIAGNOSIS — E785 Hyperlipidemia, unspecified: Secondary | ICD-10-CM

## 2017-12-08 DIAGNOSIS — E1151 Type 2 diabetes mellitus with diabetic peripheral angiopathy without gangrene: Secondary | ICD-10-CM

## 2017-12-08 DIAGNOSIS — E1165 Type 2 diabetes mellitus with hyperglycemia: Principal | ICD-10-CM

## 2017-12-08 DIAGNOSIS — E039 Hypothyroidism, unspecified: Secondary | ICD-10-CM

## 2017-12-08 DIAGNOSIS — IMO0002 Reserved for concepts with insufficient information to code with codable children: Secondary | ICD-10-CM

## 2017-12-08 NOTE — Telephone Encounter (Signed)
She was supposed to have blood done in Jan Overdue for ov

## 2017-12-08 NOTE — Telephone Encounter (Signed)
Pt is leaving for [place] and when she returns she will schedule an OV/thx dmf

## 2017-12-08 NOTE — Telephone Encounter (Signed)
YL-Plz see message below/does pt need to have labs drawn prior to next visit? Plz advise/thx dmf

## 2018-02-06 ENCOUNTER — Ambulatory Visit (INDEPENDENT_AMBULATORY_CARE_PROVIDER_SITE_OTHER): Payer: Medicare HMO | Admitting: Family Medicine

## 2018-02-06 ENCOUNTER — Encounter: Payer: Self-pay | Admitting: Family Medicine

## 2018-02-06 VITALS — BP 110/60 | HR 57 | Temp 98.0°F | Resp 16 | Ht 63.0 in | Wt 129.4 lb

## 2018-02-06 DIAGNOSIS — E538 Deficiency of other specified B group vitamins: Secondary | ICD-10-CM

## 2018-02-06 DIAGNOSIS — E785 Hyperlipidemia, unspecified: Secondary | ICD-10-CM

## 2018-02-06 LAB — CBC WITH DIFFERENTIAL/PLATELET
Basophils Absolute: 0 10*3/uL (ref 0.0–0.1)
Basophils Relative: 0.4 % (ref 0.0–3.0)
Eosinophils Absolute: 0.1 10*3/uL (ref 0.0–0.7)
Eosinophils Relative: 1.2 % (ref 0.0–5.0)
HCT: 40.9 % (ref 36.0–46.0)
Hemoglobin: 13.3 g/dL (ref 12.0–15.0)
Lymphocytes Relative: 38.8 % (ref 12.0–46.0)
Lymphs Abs: 2.7 10*3/uL (ref 0.7–4.0)
MCHC: 32.6 g/dL (ref 30.0–36.0)
MCV: 86.7 fl (ref 78.0–100.0)
Monocytes Absolute: 0.5 10*3/uL (ref 0.1–1.0)
Monocytes Relative: 7.5 % (ref 3.0–12.0)
Neutro Abs: 3.6 10*3/uL (ref 1.4–7.7)
Neutrophils Relative %: 52.1 % (ref 43.0–77.0)
Platelets: 259 10*3/uL (ref 150.0–400.0)
RBC: 4.71 Mil/uL (ref 3.87–5.11)
RDW: 13.9 % (ref 11.5–15.5)
WBC: 6.9 10*3/uL (ref 4.0–10.5)

## 2018-02-06 LAB — COMPREHENSIVE METABOLIC PANEL
ALT: 21 U/L (ref 0–35)
AST: 22 U/L (ref 0–37)
Albumin: 4.1 g/dL (ref 3.5–5.2)
Alkaline Phosphatase: 70 U/L (ref 39–117)
BUN: 11 mg/dL (ref 6–23)
CO2: 29 mEq/L (ref 19–32)
Calcium: 10.3 mg/dL (ref 8.4–10.5)
Chloride: 105 mEq/L (ref 96–112)
Creatinine, Ser: 0.79 mg/dL (ref 0.40–1.20)
GFR: 76.58 mL/min (ref >60.00)
Glucose, Bld: 103 mg/dL — ABNORMAL HIGH (ref 70–99)
Potassium: 5.6 mEq/L — ABNORMAL HIGH (ref 3.5–5.1)
Sodium: 140 mEq/L (ref 135–145)
Total Bilirubin: 0.5 mg/dL (ref 0.2–1.2)
Total Protein: 7.8 g/dL (ref 6.0–8.3)

## 2018-02-06 LAB — LIPID PANEL
Cholesterol: 157 mg/dL (ref 0–200)
HDL: 62.6 mg/dL (ref >39.00)
LDL Cholesterol: 72 mg/dL (ref 0–99)
NonHDL: 94.59
Total CHOL/HDL Ratio: 3
Triglycerides: 112 mg/dL (ref 0.0–149.0)
VLDL: 22.4 mg/dL (ref 0.0–40.0)

## 2018-02-06 LAB — VITAMIN B12: Vitamin B-12: 543 pg/mL (ref 211–911)

## 2018-02-06 NOTE — Patient Instructions (Signed)
Vitamin B12 Deficiency Vitamin B12 deficiency occurs when the body does not have enough vitamin B12. Vitamin B12 is an important vitamin. The body needs vitamin B12:  To make red blood cells.  To make DNA. This is the genetic material inside cells.  To help the nerves work properly so they can carry messages from the brain to the body.  Vitamin B12 deficiency can cause various health problems, such as a low red blood cell count (anemia) or nerve damage. What are the causes? This condition may be caused by:  Not eating enough foods that contain vitamin B12.  Not having enough stomach acid and digestive fluids to properly absorb vitamin B12 from the food that you eat.  Certain digestive system diseases that make it hard to absorb vitamin B12. These diseases include Crohn disease, chronic pancreatitis, and cystic fibrosis.  Pernicious anemia. This is a condition in which the body does not make enough of a protein (intrinsic factor), resulting in too few red blood cells.  Having a surgery in which part of the stomach or small intestine is removed.  Taking certain medicines that make it hard for the body to absorb vitamin B12. These medicines include: ? Heartburn medicine (antacids and proton pump inhibitors). ? An antibiotic medicine called neomycin. ? Some medicines that are used to treat diabetes, tuberculosis, gout, or high cholesterol.  What increases the risk? The following factors may make you more likely to develop a B12 deficiency:  Being older than age 50.  Eating a vegetarian or vegan diet, especially while you are pregnant.  Eating a poor diet while you are pregnant.  Taking certain drugs.  Having alcoholism.  What are the signs or symptoms? In some cases, there are no symptoms of this condition. If the condition leads to anemia or nerve damage, various symptoms can occur, such as:  Weakness.  Fatigue.  Loss of appetite.  Weight loss.  Numbness or tingling  in your hands and feet.  Redness and burning of the tongue.  Confusion or memory problems.  Depression.  Sensory problems, such as color blindness, ringing in the ears, or loss of taste.  Diarrhea or constipation.  Trouble walking.  If anemia is severe, symptoms can include:  Shortness of breath.  Dizziness.  Rapid heart rate (tachycardia).  How is this diagnosed? This condition may be diagnosed with a blood test to measure the level of vitamin B12 in your blood. You may have other tests to help find the cause of your vitamin B12 deficiency. These tests may include:  A complete blood count (CBC). This is a group of tests that measure certain characteristics of blood cells.  A blood test to measure intrinsic factor.  An endoscopy. In this procedure, a thin tube with a camera on the end is used to look into your stomach or intestines.  How is this treated? Treatment for this condition depends on the cause. Common treatment options include:  Changing your eating and drinking habits, such as: ? Eating more foods that contain vitamin B12. ? Drinking less alcohol or no alcohol.  Taking vitamin B12 supplements. Your health care provider will tell you which dosage is best for you.  Getting vitamin B12 injections.  Follow these instructions at home:  Take supplements only as told by your health care provider. Follow the directions carefully.  Get any injections that are prescribed by your health care provider.  Do not miss your appointments.  Eat lots of healthy foods that contain vitamin B12.   Ask your health care provider if you should work with a dietitian. Foods that contain vitamin B12 include: ? Meat. ? Meat from birds (poultry). ? Fish. ? Eggs. ? Cereal and dairy products that are fortified. This means that vitamin B12 has been added to the food. Check the label on the package to see if the food is fortified.  Do not abuse alcohol.  Keep all follow-up visits as  told by your health care provider. This is important. Contact a health care provider if:  Your symptoms come back. Get help right away if:  You develop shortness of breath.  You have chest pain.  You become dizzy or you lose consciousness. This information is not intended to replace advice given to you by your health care provider. Make sure you discuss any questions you have with your health care provider. Document Released: 01/09/2012 Document Revised: 03/30/2016 Document Reviewed: 03/04/2015 Elsevier Interactive Patient Education  2018 [employer]  

## 2018-02-06 NOTE — Progress Notes (Signed)
Patient ID: Kelly Wade, female   DOB: 1946-09-15, 72 y.o.   MRN: [id number]    Subjective:  I acted as a Education administrator for Dr. Carollee Herter.  Kelly Wade, Kelly Wade   Patient ID: Kelly Wade, female    DOB: [birth year], 72 y.o.   MRN: [id number]  Chief Complaint  Patient presents with  . Hyperlipidemia    HPI Patient is in today for follow up cholesterol and blood work. No complaints Patient Care Team: Carollee Herter, Alferd Apa, DO as PCP - General (Family Medicine)   Past Medical History:  Diagnosis Date  . Back pain   . Hyperlipidemia     Past Surgical History:  Procedure Laterality Date  . BACK SURGERY     In [place]  . CATARACT EXTRACTION, BILATERAL      Family History  Problem Relation Age of Onset  . Heart disease Father   . Heart disease Brother   . Colon cancer Neg Hx     Social History   Socioeconomic History  . Marital status: Married    Spouse name: Not on file  . Number of children: Not on file  . Years of education: Not on file  . Highest education level: Not on file  Occupational History  . Not on file  Social Needs  . Financial resource strain: Not on file  . Food insecurity:    Worry: Not on file    Inability: Not on file  . Transportation needs:    Medical: Not on file    Non-medical: Not on file  Tobacco Use  . Smoking status: Never Smoker  . Smokeless tobacco: Never Used  Substance and Sexual Activity  . Alcohol use: No    Alcohol/week: 0.0 oz  . Drug use: No  . Sexual activity: Yes  Lifestyle  . Physical activity:    Days per week: Not on file    Minutes per session: Not on file  . Stress: Not on file  Relationships  . Social connections:    Talks on phone: Not on file    Gets together: Not on file    Attends religious service: Not on file    Active member of club or organization: Not on file    Attends meetings of clubs or organizations: Not on file    Relationship status: Not on file  . Intimate partner violence:    Fear of current or  ex partner: Not on file    Emotionally abused: Not on file    Physically abused: Not on file    Forced sexual activity: Not on file  Other Topics Concern  . Not on file  Social History Narrative   Exercise-- walking 1 hour daily,  Yoga at home    Outpatient Medications Prior to Visit  Medication Sig Dispense Refill  . aspirin 81 MG tablet Take 81 mg by mouth daily.    Marland Kitchen atorvastatin (LIPITOR) 10 MG tablet Take 1 tablet (10 mg total) by mouth daily. 90 tablet 1  . Calcium Carbonate-Vit D-Min (CALCIUM 1200 PO) Take 2 tablets by mouth daily.    . ferrous fumarate (HEMOCYTE - 106 MG FE) 325 (106 FE) MG TABS tablet Take 1 tablet by mouth.    . Multiple Vitamins-Minerals (MULTIVITAMIN WITH MINERALS) tablet Take 1 tablet by mouth daily.    . cyanocobalamin (,VITAMIN B-12,) 1000 MCG/ML injection Do b12 injection weekly x 4 weeks  then monthly 10 mL 0  . SYRINGE-NEEDLE, DISP, 3 ML 25G X 1" 3 ML MISC  To use with monthly B12 injections. 50 each 0   No facility-administered medications prior to visit.     No Known Allergies  Review of Systems  Constitutional: Negative for fever and malaise/fatigue.  HENT: Negative for congestion.   Eyes: Negative for blurred vision.  Respiratory: Negative for cough and shortness of breath.   Cardiovascular: Negative for chest pain, palpitations and leg swelling.  Gastrointestinal: Negative for vomiting.  Musculoskeletal: Negative for back pain.  Skin: Negative for rash.  Neurological: Negative for loss of consciousness and headaches.       Objective:    Physical Exam  Constitutional: She is oriented to person, place, and time. She appears well-developed and well-nourished.  HENT:  Head: Normocephalic and atraumatic.  Eyes: Conjunctivae and EOM are normal.  Neck: Normal range of motion. Neck supple. No JVD present. Carotid bruit is not present. No thyromegaly present.  Cardiovascular: Normal rate, regular rhythm and normal heart sounds.  No murmur  heard. Pulmonary/Chest: Effort normal and breath sounds normal. No respiratory distress. She has no wheezes. She has no rales. She exhibits no tenderness.  Musculoskeletal: She exhibits no edema.  Neurological: She is alert and oriented to person, place, and time.  Psychiatric: She has a normal mood and affect.  Nursing note and vitals reviewed.   BP 110/60 (BP Location: Left Arm, Cuff Size: Normal)   Pulse (!) 57   Temp 98 F (36.7 C) (Oral)   Resp 16   Ht 5\' 3"  (1.6 m)   Wt 129 lb 6.4 oz (58.7 kg)   SpO2 97%   BMI 22.92 kg/m  Wt Readings from Last 3 Encounters:  02/06/18 129 lb 6.4 oz (58.7 kg)  05/29/17 127 lb 2 oz (57.7 kg)  07/11/16 126 lb 3.2 oz (57.2 kg)   BP Readings from Last 3 Encounters:  02/06/18 110/60  05/29/17 102/62  07/11/16 113/79     Immunization History  Administered Date(s) Administered  . Influenza, High Dose Seasonal PF 11/30/2015, 07/11/2016  . Influenza,inj,Quad PF,6+ Mos 09/18/2014  . Pneumococcal Conjugate-13 07/11/2016  . Pneumococcal Polysaccharide-23 07/01/2014    Health Maintenance  Topic Date Due  . Hepatitis C Screening  05-07-1946  . FOOT EXAM  08/10/1958  . OPHTHALMOLOGY EXAM  08/10/1958  . URINE MICROALBUMIN  08/10/1958  . TETANUS/TDAP  08/11/1967  . HEMOGLOBIN A1C  04/04/2016  . INFLUENZA VACCINE  05/31/2018  . MAMMOGRAM  05/24/2019  . COLONOSCOPY  10/20/2020  . DEXA SCAN  Completed  . PNA vac Low Risk Adult  Completed    Lab Results  Component Value Date   WBC 6.9 02/06/2018   HGB 13.3 02/06/2018   HCT 40.9 02/06/2018   PLT 259.0 02/06/2018   GLUCOSE 103 (H) 02/06/2018   CHOL 157 02/06/2018   TRIG 112.0 02/06/2018   HDL 62.60 02/06/2018   LDLCALC 72 02/06/2018   ALT 21 02/06/2018   AST 22 02/06/2018   NA 140 02/06/2018   K 5.6 (H) 02/06/2018   CL 105 02/06/2018   CREATININE 0.79 02/06/2018   BUN 11 02/06/2018   CO2 29 02/06/2018   TSH 5.34 (H) 05/29/2017    Lab Results  Component Value Date   TSH 5.34 (H)  05/29/2017   Lab Results  Component Value Date   WBC 6.9 02/06/2018   HGB 13.3 02/06/2018   HCT 40.9 02/06/2018   MCV 86.7 02/06/2018   PLT 259.0 02/06/2018   Lab Results  Component Value Date   NA 140 02/06/2018  K 5.6 (H) 02/06/2018   CO2 29 02/06/2018   GLUCOSE 103 (H) 02/06/2018   BUN 11 02/06/2018   CREATININE 0.79 02/06/2018   BILITOT 0.5 02/06/2018   ALKPHOS 70 02/06/2018   AST 22 02/06/2018   ALT 21 02/06/2018   PROT 7.8 02/06/2018   ALBUMIN 4.1 02/06/2018   CALCIUM 10.3 02/06/2018   GFR 76.58 02/06/2018   Lab Results  Component Value Date   CHOL 157 02/06/2018   Lab Results  Component Value Date   HDL 62.60 02/06/2018   Lab Results  Component Value Date   LDLCALC 72 02/06/2018   Lab Results  Component Value Date   TRIG 112.0 02/06/2018   Lab Results  Component Value Date   CHOLHDL 3 02/06/2018   No results found for: HGBA1C       Assessment & Plan:   Problem List Items Addressed This Visit      Unprioritized   Hyperlipidemia LDL goal <100    Tolerating statin, encouraged heart healthy diet, avoid trans fats, minimize simple carbs and saturated fats. Increase exercise as tolerated       Other Visit Diagnoses    B12 deficiency    -  Primary   Relevant Orders   Vitamin B12 (Completed)   CBC with Differential/Platelet (Completed)   Hyperlipidemia, unspecified hyperlipidemia type       Relevant Orders   Lipid panel (Completed)   Comprehensive metabolic panel (Completed)      I have discontinued Garnell Galea's cyanocobalamin and SYRINGE-NEEDLE (DISP) 3 ML. I am also having her maintain her Calcium Carbonate-Vit D-Min (CALCIUM 1200 PO), aspirin, multivitamin with minerals, ferrous fumarate, and atorvastatin.  No orders of the defined types were placed in this encounter.   CMA served as Education administrator during this visit. History, Physical and Plan performed by medical provider. Documentation and orders reviewed and attested to.  Ann Held, DO

## 2018-02-07 NOTE — Assessment & Plan Note (Signed)
Tolerating statin, encouraged heart healthy diet, avoid trans fats, minimize simple carbs and saturated fats. Increase exercise as tolerated 

## 2018-02-09 ENCOUNTER — Encounter: Payer: Self-pay | Admitting: *Deleted

## 2018-02-21 ENCOUNTER — Telehealth: Payer: Self-pay | Admitting: Family Medicine

## 2018-02-21 NOTE — Telephone Encounter (Signed)
Patient is requesting a RX for B12. Patient is requesting to have paper RX. Please call patient if RX is approved.

## 2018-02-22 MED ORDER — VITAMIN B-12 500 MCG SL SUBL: 1.0000 | SUBLINGUAL_TABLET | Freq: Every day | SUBLINGUAL | 3 refills | Status: DC

## 2018-02-22 NOTE — Telephone Encounter (Signed)
Patient husband notified rx is ready for pickup.

## 2018-02-23 ENCOUNTER — Telehealth: Payer: Self-pay | Admitting: Family Medicine

## 2018-02-23 ENCOUNTER — Other Ambulatory Visit: Payer: Self-pay

## 2018-02-23 MED ORDER — VITAMIN B-12 500 MCG SL SUBL: 1.0000 | SUBLINGUAL_TABLET | Freq: Every day | SUBLINGUAL | 3 refills | Status: DC

## 2018-02-23 NOTE — Telephone Encounter (Signed)
Husband is my pt--- not sure why she wants transfer

## 2018-02-23 NOTE — Telephone Encounter (Signed)
Patient sps came in the office on 02-23-2018- @ 2pm. Requesting that his wife transfer from [facility] to [place].     Patient requesting to transfer to [place] from [facility] ?   Will you ladies accept this ?

## 2018-02-23 NOTE — Telephone Encounter (Signed)
I am sorry,but I not taking internal transfers. Is her husband my patient?

## 2018-02-26 NOTE — Telephone Encounter (Signed)
Thank you so much will call and inform information.

## 2018-03-12 ENCOUNTER — Telehealth: Payer: Self-pay | Admitting: *Deleted

## 2018-03-12 NOTE — Telephone Encounter (Signed)
Received request for Medical Records from [facility]; forwarded to [place] for email/scan/SLS 05/13

## 2018-04-09 DIAGNOSIS — E785 Hyperlipidemia, unspecified: Secondary | ICD-10-CM | POA: Diagnosis not present

## 2018-04-16 ENCOUNTER — Other Ambulatory Visit: Payer: Self-pay | Admitting: Family Medicine

## 2018-04-16 DIAGNOSIS — Z1231 Encounter for screening mammogram for malignant neoplasm of breast: Secondary | ICD-10-CM

## 2018-04-17 DIAGNOSIS — H43813 Vitreous degeneration, bilateral: Secondary | ICD-10-CM | POA: Diagnosis not present

## 2018-04-17 DIAGNOSIS — Z961 Presence of intraocular lens: Secondary | ICD-10-CM | POA: Diagnosis not present

## 2018-04-25 DIAGNOSIS — R69 Illness, unspecified: Secondary | ICD-10-CM | POA: Diagnosis not present

## 2018-05-31 ENCOUNTER — Encounter: Payer: Medicare PPO | Admitting: Family Medicine

## 2018-06-04 ENCOUNTER — Ambulatory Visit: Payer: Medicare HMO

## 2018-06-18 ENCOUNTER — Ambulatory Visit
Admission: RE | Admit: 2018-06-18 | Discharge: 2018-06-18 | Disposition: A | Discharge status: Home / Self Care | Payer: Medicare HMO | Source: Ambulatory Visit | Attending: Family Medicine | Admitting: Family Medicine

## 2018-06-18 DIAGNOSIS — Z1231 Encounter for screening mammogram for malignant neoplasm of breast: Secondary | ICD-10-CM | POA: Diagnosis not present

## 2018-07-27 DIAGNOSIS — R69 Illness, unspecified: Secondary | ICD-10-CM | POA: Diagnosis not present

## 2019-02-21 DIAGNOSIS — E785 Hyperlipidemia, unspecified: Secondary | ICD-10-CM | POA: Diagnosis not present

## 2019-02-21 DIAGNOSIS — Z1389 Encounter for screening for other disorder: Secondary | ICD-10-CM | POA: Diagnosis not present

## 2019-02-21 DIAGNOSIS — Z0000 Encounter for general adult medical examination without abnormal findings: Secondary | ICD-10-CM | POA: Diagnosis not present

## 2019-02-21 DIAGNOSIS — D509 Iron deficiency anemia, unspecified: Secondary | ICD-10-CM | POA: Diagnosis not present

## 2019-02-21 DIAGNOSIS — E538 Deficiency of other specified B group vitamins: Secondary | ICD-10-CM | POA: Diagnosis not present

## 2019-04-24 DIAGNOSIS — R946 Abnormal results of thyroid function studies: Secondary | ICD-10-CM | POA: Diagnosis not present

## 2019-05-30 ENCOUNTER — Other Ambulatory Visit: Payer: Self-pay | Admitting: Family Medicine

## 2019-05-30 DIAGNOSIS — Z1231 Encounter for screening mammogram for malignant neoplasm of breast: Secondary | ICD-10-CM

## 2019-07-01 DIAGNOSIS — R946 Abnormal results of thyroid function studies: Secondary | ICD-10-CM | POA: Diagnosis not present

## 2019-07-15 ENCOUNTER — Ambulatory Visit: Payer: Medicare HMO

## 2019-07-19 DIAGNOSIS — Z20828 Contact with and (suspected) exposure to other viral communicable diseases: Secondary | ICD-10-CM | POA: Diagnosis not present

## 2019-08-22 ENCOUNTER — Ambulatory Visit
Admission: RE | Admit: 2019-08-22 | Discharge: 2019-08-22 | Disposition: A | Discharge status: Home / Self Care | Payer: Medicare HMO | Source: Ambulatory Visit | Attending: Family Medicine | Admitting: Family Medicine

## 2019-08-22 ENCOUNTER — Other Ambulatory Visit: Payer: Self-pay

## 2019-08-22 DIAGNOSIS — Z1231 Encounter for screening mammogram for malignant neoplasm of breast: Secondary | ICD-10-CM | POA: Diagnosis not present

## 2019-08-28 DIAGNOSIS — Z23 Encounter for immunization: Secondary | ICD-10-CM | POA: Diagnosis not present

## 2019-12-01 ENCOUNTER — Ambulatory Visit: Payer: Medicare HMO

## 2019-12-06 ENCOUNTER — Ambulatory Visit: Payer: Medicare HMO

## 2019-12-07 ENCOUNTER — Ambulatory Visit: Payer: Medicare HMO | Attending: Internal Medicine

## 2019-12-07 DIAGNOSIS — Z23 Encounter for immunization: Secondary | ICD-10-CM | POA: Insufficient documentation

## 2019-12-07 NOTE — Progress Notes (Signed)
   Covid-19 Vaccination Clinic  Name:  Kelly Wade    MRN: [id number] DOB: 08/19/1946  12/07/2019  Kelly Wade was observed post Covid-19 immunization for 15 minutes without incidence. She was provided with Vaccine Information Sheet and instruction to access the V-Safe system.   Kelly Wade was instructed to call 911 with any severe reactions post vaccine: Marland Kitchen Difficulty breathing  . Swelling of your face and throat  . A fast heartbeat  . A bad rash all over your body  . Dizziness and weakness    Immunizations Administered    Name Date Dose VIS Date Route   Pfizer COVID-19 Vaccine 12/07/2019  6:00 PM 0.3 mL 10/11/2019 Intramuscular   Manufacturer: [place]   Lot: [id number]   [place]: [id number]

## 2020-01-01 ENCOUNTER — Ambulatory Visit: Payer: Medicare HMO | Attending: Internal Medicine

## 2020-01-01 DIAGNOSIS — Z23 Encounter for immunization: Secondary | ICD-10-CM

## 2020-01-01 NOTE — Progress Notes (Signed)
   Covid-19 Vaccination Clinic  Name:  Kelly Wade    MRN: [id number] DOB: 10-16-1946  01/01/2020  [name] was observed post Covid-19 immunization for 15 minutes without incident. She was provided with Vaccine Information Sheet and instruction to access the V-Safe system.   [name] was instructed to call 911 with any severe reactions post vaccine: Marland Kitchen Difficulty breathing  . Swelling of face and throat  . A fast heartbeat  . A bad rash all over body  . Dizziness and weakness   Immunizations Administered    Name Date Dose VIS Date Route   Pfizer COVID-19 Vaccine 01/01/2020  3:14 PM 0.3 mL 10/11/2019 Intramuscular   Manufacturer: [place]   Lot: [id number]   [place]: [id number]

## 2020-02-24 DIAGNOSIS — Z0000 Encounter for general adult medical examination without abnormal findings: Secondary | ICD-10-CM | POA: Diagnosis not present

## 2020-02-24 DIAGNOSIS — E785 Hyperlipidemia, unspecified: Secondary | ICD-10-CM | POA: Diagnosis not present

## 2020-02-24 DIAGNOSIS — Z1389 Encounter for screening for other disorder: Secondary | ICD-10-CM | POA: Diagnosis not present

## 2020-02-24 DIAGNOSIS — E039 Hypothyroidism, unspecified: Secondary | ICD-10-CM | POA: Diagnosis not present

## 2020-03-23 ENCOUNTER — Other Ambulatory Visit: Payer: Self-pay

## 2020-03-23 ENCOUNTER — Emergency Department (HOSPITAL_COMMUNITY): Payer: Medicare HMO

## 2020-03-23 ENCOUNTER — Emergency Department (HOSPITAL_COMMUNITY)
Admission: EM | Admit: 2020-03-23 | Discharge: 2020-03-23 | Disposition: A | Discharge status: Home / Self Care | Payer: Medicare HMO | Attending: Emergency Medicine | Admitting: Emergency Medicine

## 2020-03-23 ENCOUNTER — Encounter (HOSPITAL_COMMUNITY): Payer: Self-pay

## 2020-03-23 DIAGNOSIS — S299XXA Unspecified injury of thorax, initial encounter: Secondary | ICD-10-CM | POA: Diagnosis not present

## 2020-03-23 DIAGNOSIS — Y998 Other external cause status: Secondary | ICD-10-CM | POA: Diagnosis not present

## 2020-03-23 DIAGNOSIS — Z79899 Other long term (current) drug therapy: Secondary | ICD-10-CM | POA: Insufficient documentation

## 2020-03-23 DIAGNOSIS — S3992XA Unspecified injury of lower back, initial encounter: Secondary | ICD-10-CM | POA: Diagnosis not present

## 2020-03-23 DIAGNOSIS — M542 Cervicalgia: Secondary | ICD-10-CM | POA: Diagnosis not present

## 2020-03-23 DIAGNOSIS — Y92410 Unspecified street and highway as the place of occurrence of the external cause: Secondary | ICD-10-CM | POA: Insufficient documentation

## 2020-03-23 DIAGNOSIS — Z7982 Long term (current) use of aspirin: Secondary | ICD-10-CM | POA: Insufficient documentation

## 2020-03-23 DIAGNOSIS — Y9389 Activity, other specified: Secondary | ICD-10-CM | POA: Diagnosis not present

## 2020-03-23 DIAGNOSIS — S29012A Strain of muscle and tendon of back wall of thorax, initial encounter: Secondary | ICD-10-CM | POA: Diagnosis not present

## 2020-03-23 DIAGNOSIS — S39012A Strain of muscle, fascia and tendon of lower back, initial encounter: Secondary | ICD-10-CM | POA: Insufficient documentation

## 2020-03-23 MED ORDER — DICLOFENAC SODIUM 1 % EX GEL: 2.0000 g | Freq: Four times a day (QID) | CUTANEOUS | 0 refills | Status: DC

## 2020-03-23 MED ORDER — METHOCARBAMOL 500 MG PO TABS
500.0000 mg | ORAL_TABLET | Freq: Two times a day (BID) | ORAL | 0 refills | Status: DC
Start: 2020-03-23 — End: 2020-10-19
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
## 2020-05-25 DIAGNOSIS — R7989 Other specified abnormal findings of blood chemistry: Secondary | ICD-10-CM | POA: Diagnosis not present

## 2020-05-25 DIAGNOSIS — E039 Hypothyroidism, unspecified: Secondary | ICD-10-CM | POA: Diagnosis not present
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
## 2020-07-13 ENCOUNTER — Other Ambulatory Visit: Payer: Self-pay | Admitting: Family Medicine

## 2020-07-13 DIAGNOSIS — Z1231 Encounter for screening mammogram for malignant neoplasm of breast: Secondary | ICD-10-CM

## 2020-07-16 DIAGNOSIS — R69 Illness, unspecified: Secondary | ICD-10-CM | POA: Diagnosis not present

## 2020-08-24 ENCOUNTER — Ambulatory Visit: Payer: Medicare HMO

## 2020-08-25 DIAGNOSIS — H524 Presbyopia: Secondary | ICD-10-CM | POA: Diagnosis not present

## 2020-08-25 DIAGNOSIS — H52223 Regular astigmatism, bilateral: Secondary | ICD-10-CM | POA: Diagnosis not present

## 2020-08-25 DIAGNOSIS — H5213 Myopia, bilateral: Secondary | ICD-10-CM | POA: Diagnosis not present

## 2020-09-07 DIAGNOSIS — R69 Illness, unspecified: Secondary | ICD-10-CM | POA: Diagnosis not present

## 2020-09-08 ENCOUNTER — Encounter: Payer: Self-pay | Admitting: Gastroenterology

## 2020-09-22 ENCOUNTER — Ambulatory Visit
Admission: RE | Admit: 2020-09-22 | Discharge: 2020-09-22 | Disposition: A | Discharge status: Home / Self Care | Payer: Medicare HMO | Source: Ambulatory Visit | Attending: Family Medicine | Admitting: Family Medicine

## 2020-09-22 ENCOUNTER — Other Ambulatory Visit: Payer: Self-pay

## 2020-09-22 DIAGNOSIS — Z1231 Encounter for screening mammogram for malignant neoplasm of breast: Secondary | ICD-10-CM | POA: Diagnosis not present

## 2020-09-29 DIAGNOSIS — R69 Illness, unspecified: Secondary | ICD-10-CM | POA: Diagnosis not present

## 2020-10-19 ENCOUNTER — Ambulatory Visit (AMBULATORY_SURGERY_CENTER): Payer: Self-pay | Admitting: *Deleted

## 2020-10-19 ENCOUNTER — Other Ambulatory Visit: Payer: Self-pay

## 2020-10-19 VITALS — Ht 63.0 in | Wt 124.0 lb

## 2020-10-19 DIAGNOSIS — Z86010 Personal history of colonic polyps: Secondary | ICD-10-CM

## 2020-10-19 MED ORDER — PLENVU 140 G PO SOLR: 1.0000 | Freq: Once | ORAL | 0 refills | Status: AC
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
## 2020-10-20 ENCOUNTER — Encounter: Payer: Self-pay | Admitting: Gastroenterology

## 2020-10-22 DIAGNOSIS — R69 Illness, unspecified: Secondary | ICD-10-CM | POA: Diagnosis not present

## 2020-11-03 ENCOUNTER — Ambulatory Visit (AMBULATORY_SURGERY_CENTER): Payer: Medicare HMO | Admitting: Gastroenterology

## 2020-11-03 ENCOUNTER — Encounter: Payer: Self-pay | Admitting: Gastroenterology

## 2020-11-03 ENCOUNTER — Other Ambulatory Visit: Payer: Self-pay

## 2020-11-03 VITALS — BP 120/71 | HR 68 | Temp 96.9°F | Resp 16 | Ht 63.0 in | Wt 124.0 lb

## 2020-11-03 DIAGNOSIS — E785 Hyperlipidemia, unspecified: Secondary | ICD-10-CM | POA: Diagnosis not present

## 2020-11-03 DIAGNOSIS — D124 Benign neoplasm of descending colon: Secondary | ICD-10-CM

## 2020-11-03 DIAGNOSIS — D123 Benign neoplasm of transverse colon: Secondary | ICD-10-CM | POA: Diagnosis not present

## 2020-11-03 DIAGNOSIS — Z860101 Personal history of adenomatous and serrated colon polyps: Secondary | ICD-10-CM

## 2020-11-03 DIAGNOSIS — E039 Hypothyroidism, unspecified: Secondary | ICD-10-CM | POA: Diagnosis not present

## 2020-11-03 DIAGNOSIS — Z86010 Personal history of colonic polyps: Secondary | ICD-10-CM | POA: Diagnosis not present

## 2020-11-03 MED ORDER — SODIUM CHLORIDE 0.9 % IV SOLN: 500.0000 mL | Freq: Once | INTRAVENOUS | Status: AC

## 2020-11-03 NOTE — Progress Notes (Signed)
Pt's states no medical or surgical changes since previsit or office visit.  JD IV and Sh vitals.

## 2020-11-03 NOTE — Progress Notes (Signed)
Report to PACU, RN, vss, BBS= Clear.  

## 2020-11-03 NOTE — Patient Instructions (Signed)
Handouts Provided:  Polyps  YOU HAD AN ENDOSCOPIC PROCEDURE TODAY AT THE [facility]:   Refer to the procedure report that was given to you for any specific questions about what was found during the examination.  If the procedure report does not answer your questions, please call your gastroenterologist to clarify.  If you requested that your care partner not be given the details of your procedure findings, then the procedure report has been included in a sealed envelope for you to review at your convenience later.  YOU SHOULD EXPECT: Some feelings of bloating in the abdomen. Passage of more gas than usual.  Walking can help get rid of the air that was put into your GI tract during the procedure and reduce the bloating. If you had a lower endoscopy (such as a colonoscopy or flexible sigmoidoscopy) you may notice spotting of blood in your stool or on the toilet paper. If you underwent a bowel prep for your procedure, you may not have a normal bowel movement for a few days.  Please Note:  You might notice some irritation and congestion in your nose or some drainage.  This is from the oxygen used during your procedure.  There is no need for concern and it should clear up in a day or so.  SYMPTOMS TO REPORT IMMEDIATELY:   Following lower endoscopy (colonoscopy or flexible sigmoidoscopy):  Excessive amounts of blood in the stool  Significant tenderness or worsening of abdominal pains  Swelling of the abdomen that is new, acute  Fever of 100F or higher  For urgent or emergent issues, a gastroenterologist can be reached at any hour by calling [phone]. Do not use MyChart messaging for urgent concerns.    DIET:  We do recommend a small meal at first, but then you may proceed to your regular diet.  Drink plenty of fluids but you should avoid alcoholic beverages for 24 hours.  ACTIVITY:  You should plan to take it easy for the rest of today and you should NOT DRIVE or use heavy  machinery until tomorrow (because of the sedation medicines used during the test).    FOLLOW UP: Our staff will call the number listed on your records 48-72 hours following your procedure to check on you and address any questions or concerns that you may have regarding the information given to you following your procedure. If we do not reach you, we will leave a message.  We will attempt to reach you two times.  During this call, we will ask if you have developed any symptoms of COVID 19. If you develop any symptoms (ie: fever, flu-like symptoms, shortness of breath, cough etc.) before then, please call [phone].  If you test positive for Covid 19 in the 2 weeks post procedure, please call and report this information to us.    If any biopsies were taken you will be contacted by phone or by letter within the next 1-3 weeks.  Please call us at [phone] if you have not heard about the biopsies in 3 weeks.    SIGNATURES/CONFIDENTIALITY: You and/or your care partner have signed paperwork which will be entered into your electronic medical record.  These signatures attest to the fact that that the information above on your After Visit Summary has been reviewed and is understood.  Full responsibility of the confidentiality of this discharge information lies with you and/or your care-partner.  

## 2020-11-03 NOTE — Op Note (Signed)
[facility] Patient Name: Kelly Wade Procedure Date: 11/03/2020 8:32 AM MRN: [id number] Endoscopist: Meryl Dare , MD Age: 75 Referring MD:  Date of Birth: 02-11-1946 Gender: Female Account #: [id number] Procedure:                Colonoscopy Indications:              Surveillance: Personal history of adenomatous                            polyps on last colonoscopy 5 years ago Medicines:                Monitored Anesthesia Care Procedure:                Pre-Anesthesia Assessment:                           - Prior to the procedure, a History and Physical                            was performed, and patient medications and                            allergies were reviewed. The patient's tolerance of                            previous anesthesia was also reviewed. The risks                            and benefits of the procedure and the sedation                            options and risks were discussed with the patient.                            All questions were answered, and informed consent                            was obtained. Prior Anticoagulants: The patient has                            taken no previous anticoagulant or antiplatelet                            agents. ASA Grade Assessment: II - A patient with                            mild systemic disease. After reviewing the risks                            and benefits, the patient was deemed in                            satisfactory condition to undergo the procedure.  After obtaining informed consent, the colonoscope                            was passed under direct vision. Throughout the                            procedure, the patient's blood pressure, pulse, and                            oxygen saturations were monitored continuously. The                            Olympus [id number]) Colonoscope was                            introduced through the anus  and advanced to the the                            cecum, identified by appendiceal orifice and                            ileocecal valve. The ileocecal valve, appendiceal                            orifice, and rectum were photographed. The quality                            of the bowel preparation was excellent. The                            colonoscopy was performed without difficulty. The                            patient tolerated the procedure well. Scope In: 8:39:12 AM Scope Out: 8:55:53 AM Scope Withdrawal Time: 0 hours 13 minutes 57 seconds  Total Procedure Duration: 0 hours 16 minutes 41 seconds  Findings:                 Skin tags were found on perianal exam.                           Four sessile polyps were found in the descending                            colon (3) and transverse colon (1). The polyps were                            6 to 9 mm in size. These polyps were removed with a                            cold snare. Resection and retrieval were complete.                           Two medium-sized localized angiodysplastic  lesions                            without bleeding were found in the cecum.                           The exam was otherwise without abnormality on                            direct and retroflexion views. Complications:            No immediate complications. Estimated blood loss:                            None. Estimated Blood Loss:     Estimated blood loss: none. Impression:               - Perianal skin tags found on perianal exam.                           - Four sessile polyps in the descending colon and                            transverse colon. Removed and retrieved by cold                            snare.                           - 2 cecal AVMs.                           - Otherwise normal appearing colonoscopy. Recommendation:           - Repeat colonoscopy after studies are complete for                            surveillance  based on pathology results.                           - Patient has a contact number available for                            emergencies. The signs and symptoms of potential                            delayed complications were discussed with the                            patient. Return to normal activities tomorrow.                            Written discharge instructions were provided to the                            patient.                           -  Resume previous diet.                           - Continue present medications.                           - Await pathology results. Ladene Artist, MD 11/03/2020 9:02:58 AM This report has been signed electronically.

## 2020-11-05 ENCOUNTER — Telehealth: Payer: Self-pay

## 2020-11-05 NOTE — Telephone Encounter (Signed)
  Follow up Call-  Call back number 11/03/2020  Post procedure Call Back phone  # [phone]  Permission to leave phone message Yes  Some recent data might be hidden     Left message

## 2020-11-05 NOTE — Telephone Encounter (Signed)
  Follow up Call-  Call back number 11/03/2020  Post procedure Call Back phone  # [phone]  Permission to leave phone message Yes  Some recent data might be hidden    2nd follow up call made. NALM

## 2020-11-06 ENCOUNTER — Encounter: Payer: Self-pay | Admitting: Gastroenterology

## 2021-03-03 DIAGNOSIS — Z0000 Encounter for general adult medical examination without abnormal findings: Secondary | ICD-10-CM | POA: Diagnosis not present

## 2021-03-03 DIAGNOSIS — E785 Hyperlipidemia, unspecified: Secondary | ICD-10-CM | POA: Diagnosis not present

## 2021-03-03 DIAGNOSIS — E039 Hypothyroidism, unspecified: Secondary | ICD-10-CM | POA: Diagnosis not present

## 2021-03-03 DIAGNOSIS — Z1159 Encounter for screening for other viral diseases: Secondary | ICD-10-CM | POA: Diagnosis not present

## 2021-03-03 DIAGNOSIS — Z1389 Encounter for screening for other disorder: Secondary | ICD-10-CM | POA: Diagnosis not present

## 2021-03-22 ENCOUNTER — Other Ambulatory Visit: Payer: Self-pay | Admitting: Family Medicine
